# Patient Record
Sex: Female | Born: 1945 | Race: White | Hispanic: No | Marital: Married | Smoking: Never smoker
Health system: Southern US, Community
[De-identification: ages and names within clinical notes are randomized; demographics above are authoritative.]

## PROBLEM LIST (undated history)

## (undated) DIAGNOSIS — K635 Polyp of colon: Secondary | ICD-10-CM

## (undated) DIAGNOSIS — K5790 Diverticulosis of intestine, part unspecified, without perforation or abscess without bleeding: Secondary | ICD-10-CM

## (undated) DIAGNOSIS — M1990 Unspecified osteoarthritis, unspecified site: Secondary | ICD-10-CM

## (undated) DIAGNOSIS — F329 Major depressive disorder, single episode, unspecified: Secondary | ICD-10-CM

## (undated) DIAGNOSIS — K760 Fatty (change of) liver, not elsewhere classified: Secondary | ICD-10-CM

## (undated) DIAGNOSIS — F32A Depression, unspecified: Secondary | ICD-10-CM

## (undated) DIAGNOSIS — K5792 Diverticulitis of intestine, part unspecified, without perforation or abscess without bleeding: Secondary | ICD-10-CM

## (undated) DIAGNOSIS — I10 Essential (primary) hypertension: Secondary | ICD-10-CM

## (undated) DIAGNOSIS — M797 Fibromyalgia: Secondary | ICD-10-CM

## (undated) DIAGNOSIS — E039 Hypothyroidism, unspecified: Secondary | ICD-10-CM

## (undated) DIAGNOSIS — M8580 Other specified disorders of bone density and structure, unspecified site: Secondary | ICD-10-CM

## (undated) DIAGNOSIS — M4800 Spinal stenosis, site unspecified: Secondary | ICD-10-CM

## (undated) HISTORY — DX: Diverticulitis of intestine, part unspecified, without perforation or abscess without bleeding: K57.92

## (undated) HISTORY — PX: BREAST REDUCTION SURGERY: SHX8

## (undated) HISTORY — DX: Hypothyroidism, unspecified: E03.9

## (undated) HISTORY — DX: Major depressive disorder, single episode, unspecified: F32.9

## (undated) HISTORY — PX: LIPOSUCTION TRUNK: SUR833

## (undated) HISTORY — DX: Unspecified osteoarthritis, unspecified site: M19.90

## (undated) HISTORY — PX: ABDOMINAL HYSTERECTOMY: SHX81

## (undated) HISTORY — DX: Polyp of colon: K63.5

## (undated) HISTORY — PX: OTHER SURGICAL HISTORY: SHX169

## (undated) HISTORY — PX: COLONOSCOPY: SHX174

## (undated) HISTORY — PX: POLYPECTOMY: SHX149

## (undated) HISTORY — PX: TUBAL LIGATION: SHX77

## (undated) HISTORY — DX: Depression, unspecified: F32.A

## (undated) HISTORY — DX: Diverticulosis of intestine, part unspecified, without perforation or abscess without bleeding: K57.90

## (undated) HISTORY — DX: Fibromyalgia: M79.7

## (undated) HISTORY — DX: Other specified disorders of bone density and structure, unspecified site: M85.80

## (undated) HISTORY — DX: Spinal stenosis, site unspecified: M48.00

## (undated) HISTORY — DX: Fatty (change of) liver, not elsewhere classified: K76.0

## (undated) HISTORY — DX: Essential (primary) hypertension: I10

---

## 1998-04-22 ENCOUNTER — Ambulatory Visit (HOSPITAL_COMMUNITY): Admission: RE | Admit: 1998-04-22 | Discharge: 1998-04-22 | Payer: Self-pay | Admitting: Internal Medicine

## 1998-06-10 ENCOUNTER — Other Ambulatory Visit: Admission: RE | Admit: 1998-06-10 | Discharge: 1998-06-10 | Payer: Self-pay | Admitting: Plastic Surgery

## 2000-11-30 ENCOUNTER — Encounter: Payer: Self-pay | Admitting: Internal Medicine

## 2000-12-30 ENCOUNTER — Ambulatory Visit (HOSPITAL_BASED_OUTPATIENT_CLINIC_OR_DEPARTMENT_OTHER): Admission: RE | Admit: 2000-12-30 | Discharge: 2000-12-30 | Payer: Self-pay | Admitting: Orthopedic Surgery

## 2003-08-06 ENCOUNTER — Encounter: Payer: Self-pay | Admitting: Family Medicine

## 2003-08-17 ENCOUNTER — Emergency Department (HOSPITAL_COMMUNITY): Admission: EM | Admit: 2003-08-17 | Discharge: 2003-08-17 | Payer: Self-pay | Admitting: *Deleted

## 2003-12-19 ENCOUNTER — Other Ambulatory Visit: Admission: RE | Admit: 2003-12-19 | Discharge: 2003-12-19 | Payer: Self-pay | Admitting: Gynecology

## 2004-01-14 ENCOUNTER — Encounter: Payer: Self-pay | Admitting: Family Medicine

## 2004-01-14 ENCOUNTER — Ambulatory Visit (HOSPITAL_COMMUNITY): Admission: RE | Admit: 2004-01-14 | Discharge: 2004-01-14 | Payer: Self-pay | Admitting: Neurosurgery

## 2004-06-24 ENCOUNTER — Encounter: Payer: Self-pay | Admitting: Family Medicine

## 2005-01-22 ENCOUNTER — Emergency Department (HOSPITAL_COMMUNITY): Admission: EM | Admit: 2005-01-22 | Discharge: 2005-01-23 | Payer: Self-pay | Admitting: Emergency Medicine

## 2005-03-25 ENCOUNTER — Ambulatory Visit: Payer: Self-pay | Admitting: Family Medicine

## 2005-03-26 ENCOUNTER — Encounter: Admission: RE | Admit: 2005-03-26 | Discharge: 2005-03-26 | Payer: Self-pay | Admitting: Family Medicine

## 2005-03-30 ENCOUNTER — Ambulatory Visit: Payer: Self-pay | Admitting: Family Medicine

## 2005-05-06 ENCOUNTER — Emergency Department (HOSPITAL_COMMUNITY): Admission: EM | Admit: 2005-05-06 | Discharge: 2005-05-07 | Payer: Self-pay | Admitting: Emergency Medicine

## 2005-08-05 ENCOUNTER — Ambulatory Visit: Payer: Self-pay | Admitting: Family Medicine

## 2005-09-29 ENCOUNTER — Ambulatory Visit: Payer: Self-pay | Admitting: Family Medicine

## 2005-10-07 ENCOUNTER — Ambulatory Visit: Payer: Self-pay | Admitting: Family Medicine

## 2005-10-08 ENCOUNTER — Encounter: Payer: Self-pay | Admitting: Gastroenterology

## 2005-10-08 ENCOUNTER — Encounter: Payer: Self-pay | Admitting: Family Medicine

## 2005-10-15 ENCOUNTER — Ambulatory Visit: Payer: Self-pay | Admitting: Internal Medicine

## 2005-10-28 ENCOUNTER — Encounter (INDEPENDENT_AMBULATORY_CARE_PROVIDER_SITE_OTHER): Payer: Self-pay | Admitting: *Deleted

## 2005-10-28 ENCOUNTER — Ambulatory Visit: Payer: Self-pay | Admitting: Internal Medicine

## 2005-10-28 LAB — HM COLONOSCOPY

## 2006-01-18 ENCOUNTER — Other Ambulatory Visit: Admission: RE | Admit: 2006-01-18 | Discharge: 2006-01-18 | Payer: Self-pay | Admitting: Gynecology

## 2006-02-02 ENCOUNTER — Ambulatory Visit: Payer: Self-pay | Admitting: Family Medicine

## 2006-04-05 ENCOUNTER — Ambulatory Visit: Payer: Self-pay | Admitting: Family Medicine

## 2006-05-31 ENCOUNTER — Ambulatory Visit: Payer: Self-pay | Admitting: Family Medicine

## 2006-09-29 ENCOUNTER — Ambulatory Visit: Payer: Self-pay | Admitting: Family Medicine

## 2006-09-29 LAB — CONVERTED CEMR LAB
ALT: 107 units/L — ABNORMAL HIGH (ref 0–40)
Alkaline Phosphatase: 97 units/L (ref 39–117)
Bilirubin, Direct: 0.1 mg/dL (ref 0.0–0.3)
Total Bilirubin: 1 mg/dL (ref 0.3–1.2)
Total Protein: 7 g/dL (ref 6.0–8.3)

## 2006-10-18 ENCOUNTER — Ambulatory Visit: Payer: Self-pay | Admitting: Family Medicine

## 2007-01-24 ENCOUNTER — Encounter: Payer: Self-pay | Admitting: Family Medicine

## 2007-01-24 LAB — CONVERTED CEMR LAB

## 2007-01-25 ENCOUNTER — Other Ambulatory Visit: Admission: RE | Admit: 2007-01-25 | Discharge: 2007-01-25 | Payer: Self-pay | Admitting: Gynecology

## 2007-05-30 ENCOUNTER — Encounter: Payer: Self-pay | Admitting: Family Medicine

## 2007-05-30 DIAGNOSIS — Z8659 Personal history of other mental and behavioral disorders: Secondary | ICD-10-CM

## 2007-05-30 DIAGNOSIS — E781 Pure hyperglyceridemia: Secondary | ICD-10-CM | POA: Insufficient documentation

## 2007-05-30 DIAGNOSIS — IMO0001 Reserved for inherently not codable concepts without codable children: Secondary | ICD-10-CM

## 2007-05-30 DIAGNOSIS — I10 Essential (primary) hypertension: Secondary | ICD-10-CM

## 2007-05-30 DIAGNOSIS — K5730 Diverticulosis of large intestine without perforation or abscess without bleeding: Secondary | ICD-10-CM | POA: Insufficient documentation

## 2007-05-30 DIAGNOSIS — M8580 Other specified disorders of bone density and structure, unspecified site: Secondary | ICD-10-CM

## 2007-05-30 DIAGNOSIS — M479 Spondylosis, unspecified: Secondary | ICD-10-CM | POA: Insufficient documentation

## 2007-05-30 DIAGNOSIS — E039 Hypothyroidism, unspecified: Secondary | ICD-10-CM | POA: Insufficient documentation

## 2007-05-30 DIAGNOSIS — M4802 Spinal stenosis, cervical region: Secondary | ICD-10-CM

## 2007-05-31 ENCOUNTER — Ambulatory Visit: Payer: Self-pay | Admitting: Family Medicine

## 2007-05-31 DIAGNOSIS — R740 Nonspecific elevation of levels of transaminase and lactic acid dehydrogenase [LDH]: Secondary | ICD-10-CM

## 2007-08-08 ENCOUNTER — Ambulatory Visit: Payer: Self-pay | Admitting: Family Medicine

## 2007-08-09 LAB — CONVERTED CEMR LAB
ALT: 77 units/L — ABNORMAL HIGH (ref 0–35)
AST: 71 units/L — ABNORMAL HIGH (ref 0–37)
Alkaline Phosphatase: 78 units/L (ref 39–117)
Bilirubin, Direct: 0.1 mg/dL (ref 0.0–0.3)
Total Bilirubin: 0.9 mg/dL (ref 0.3–1.2)
Total Protein: 7.4 g/dL (ref 6.0–8.3)

## 2007-09-28 ENCOUNTER — Ambulatory Visit: Payer: Self-pay | Admitting: Family Medicine

## 2007-09-28 DIAGNOSIS — E669 Obesity, unspecified: Secondary | ICD-10-CM

## 2007-10-05 ENCOUNTER — Telehealth: Payer: Self-pay | Admitting: Family Medicine

## 2007-11-13 ENCOUNTER — Encounter (INDEPENDENT_AMBULATORY_CARE_PROVIDER_SITE_OTHER): Payer: Self-pay | Admitting: *Deleted

## 2007-11-27 ENCOUNTER — Ambulatory Visit: Payer: Self-pay | Admitting: Family Medicine

## 2007-11-28 LAB — CONVERTED CEMR LAB
ALT: 68 units/L — ABNORMAL HIGH (ref 0–35)
AST: 58 units/L — ABNORMAL HIGH (ref 0–37)
Albumin: 3.9 g/dL (ref 3.5–5.2)
TSH: 6 microintl units/mL — ABNORMAL HIGH (ref 0.35–5.50)
Total Protein: 7.2 g/dL (ref 6.0–8.3)

## 2007-12-01 ENCOUNTER — Ambulatory Visit: Payer: Self-pay | Admitting: Family Medicine

## 2007-12-04 ENCOUNTER — Telehealth: Payer: Self-pay | Admitting: Family Medicine

## 2007-12-04 ENCOUNTER — Encounter (INDEPENDENT_AMBULATORY_CARE_PROVIDER_SITE_OTHER): Payer: Self-pay | Admitting: *Deleted

## 2007-12-05 ENCOUNTER — Encounter: Payer: Self-pay | Admitting: Family Medicine

## 2008-01-16 ENCOUNTER — Encounter: Payer: Self-pay | Admitting: Family Medicine

## 2008-02-23 ENCOUNTER — Ambulatory Visit: Payer: Self-pay | Admitting: Family Medicine

## 2008-02-23 DIAGNOSIS — N289 Disorder of kidney and ureter, unspecified: Secondary | ICD-10-CM | POA: Insufficient documentation

## 2008-02-23 LAB — CONVERTED CEMR LAB
Bacteria, UA: 0
Casts: 0 /lpf
Glucose, Urine, Semiquant: NEGATIVE
Ketones, urine, test strip: NEGATIVE
Nitrite: NEGATIVE
Protein, U semiquant: NEGATIVE
Specific Gravity, Urine: <1.005
Urine crystals, microscopic: 0 /hpf
WBC Urine, dipstick: NEGATIVE
Yeast, UA: 0

## 2008-02-26 LAB — CONVERTED CEMR LAB
Albumin: 4 g/dL (ref 3.5–5.2)
Alkaline Phosphatase: 83 units/L (ref 39–117)
BUN: 9 mg/dL (ref 6–23)
Bilirubin, Direct: 0.1 mg/dL (ref 0.0–0.3)
CO2: 30 meq/L (ref 19–32)
Chloride: 105 meq/L (ref 96–112)
GFR calc Af Amer: 82 mL/min
GFR calc non Af Amer: 67 mL/min
Potassium: 4.2 meq/L (ref 3.5–5.1)
Total Protein: 7.1 g/dL (ref 6.0–8.3)

## 2008-09-10 ENCOUNTER — Ambulatory Visit: Payer: Self-pay | Admitting: Family Medicine

## 2008-10-15 ENCOUNTER — Telehealth: Payer: Self-pay | Admitting: Family Medicine

## 2008-11-11 ENCOUNTER — Ambulatory Visit: Payer: Self-pay | Admitting: Family Medicine

## 2008-11-11 DIAGNOSIS — M6740 Ganglion, unspecified site: Secondary | ICD-10-CM | POA: Insufficient documentation

## 2008-11-19 ENCOUNTER — Encounter: Payer: Self-pay | Admitting: Family Medicine

## 2009-01-31 ENCOUNTER — Ambulatory Visit: Payer: Self-pay | Admitting: Family Medicine

## 2009-03-11 ENCOUNTER — Ambulatory Visit: Payer: Self-pay | Admitting: Family Medicine

## 2009-06-10 DIAGNOSIS — Z862 Personal history of diseases of the blood and blood-forming organs and certain disorders involving the immune mechanism: Secondary | ICD-10-CM

## 2009-06-10 DIAGNOSIS — Z860100 Personal history of colon polyps, unspecified: Secondary | ICD-10-CM | POA: Insufficient documentation

## 2009-06-10 DIAGNOSIS — K648 Other hemorrhoids: Secondary | ICD-10-CM | POA: Insufficient documentation

## 2009-06-10 DIAGNOSIS — K7689 Other specified diseases of liver: Secondary | ICD-10-CM | POA: Insufficient documentation

## 2009-06-10 DIAGNOSIS — Z8639 Personal history of other endocrine, nutritional and metabolic disease: Secondary | ICD-10-CM

## 2009-07-30 ENCOUNTER — Ambulatory Visit: Payer: Self-pay | Admitting: Gastroenterology

## 2009-07-30 DIAGNOSIS — K5732 Diverticulitis of large intestine without perforation or abscess without bleeding: Secondary | ICD-10-CM

## 2009-08-06 ENCOUNTER — Telehealth: Payer: Self-pay | Admitting: Nurse Practitioner

## 2009-08-20 ENCOUNTER — Telehealth: Payer: Self-pay | Admitting: Family Medicine

## 2009-08-22 ENCOUNTER — Ambulatory Visit: Payer: Self-pay | Admitting: Family Medicine

## 2009-08-23 LAB — CONVERTED CEMR LAB
ALT: 52 units/L — ABNORMAL HIGH (ref 0–35)
AST: 49 units/L — ABNORMAL HIGH (ref 0–37)
Alkaline Phosphatase: 85 units/L (ref 39–117)
Chloride: 105 meq/L (ref 96–112)
Cholesterol: 191 mg/dL (ref 0–200)
Glucose, Bld: 108 mg/dL — ABNORMAL HIGH (ref 70–99)
HDL: 51.7 mg/dL (ref >39.00)
Phosphorus: 3.3 mg/dL (ref 2.3–4.6)
Potassium: 4.2 meq/L (ref 3.5–5.1)
Sodium: 142 meq/L (ref 135–145)
Total Protein: 7.9 g/dL (ref 6.0–8.3)
Triglycerides: 192 mg/dL — ABNORMAL HIGH (ref 0.0–149.0)

## 2009-09-29 ENCOUNTER — Ambulatory Visit: Payer: Self-pay | Admitting: Internal Medicine

## 2009-10-22 ENCOUNTER — Ambulatory Visit: Payer: Self-pay | Admitting: Family Medicine

## 2009-12-10 ENCOUNTER — Telehealth: Payer: Self-pay | Admitting: Internal Medicine

## 2010-02-16 ENCOUNTER — Telehealth: Payer: Self-pay | Admitting: Internal Medicine

## 2010-02-20 ENCOUNTER — Telehealth: Payer: Self-pay | Admitting: Internal Medicine

## 2010-03-02 ENCOUNTER — Ambulatory Visit: Payer: Self-pay | Admitting: Internal Medicine

## 2010-08-16 ENCOUNTER — Encounter: Payer: Self-pay | Admitting: Neurosurgery

## 2010-08-25 NOTE — Assessment & Plan Note (Signed)
Summary: F/U ABD PAIN, SAW PAULA GUENTHER RNP    History of Present Illness Visit Type: Follow-up Visit Primary GI MD: Lina Sar MD Primary Provider: Roxy Manns, MD Requesting Provider: n/a Chief Complaint: F/u for abd pain. Pt states that she is great and denies any GI complaints  History of Present Illness:   This is a 65 year old white female with chronic intermittent left lower quadrant abdominal pain. She was seen on 07/30/09 and was treated with a 10 day course of Cipro 500 mg p.o. b.i.d. She had a colonoscopy in 1996 with findings of an adenomatous polyp. A subsequent colonoscopy in 1999 showed severe diverticulosis of the left colon. Her colonoscopy in 2002 showed a hyperplastyic polyp and her most recent colonoscopy in April 2007 did not show any polyps. An upper abdominal ultrasound in March 2007 showed fatty liver. She is much improved today having only minimal left lower quadrant abdominal discomfort. There is no family history of colon cancer. Additional medical problems include high blood pressure, obesity and fibromyalgia.Marland Kitchen   GI Review of Systems      Denies abdominal pain, acid reflux, belching, bloating, chest pain, dysphagia with liquids, dysphagia with solids, heartburn, loss of appetite, nausea, vomiting, vomiting blood, weight loss, and  weight gain.        Denies anal fissure, black tarry stools, change in bowel habit, constipation, diarrhea, diverticulosis, fecal incontinence, heme positive stool, hemorrhoids, irritable bowel syndrome, jaundice, light color stool, liver problems, rectal bleeding, and  rectal pain.    Current Medications (verified): 1)  Metoprolol Tartrate 100 Mg Tabs (Metoprolol Tartrate) .... Take One By Mouth Twice A Day By Mouth  Allergies (verified): 1)  Codeine  Past History:  Past Medical History: Reviewed history from 07/30/2009 and no changes required. Diverticulosis,  colon Diverticulitis Hypertension Hypothyroidism Osteopenia Arthritis Fibromyalgia  Past Surgical History: Reviewed history from 07/30/2009 and no changes required. Hysterectomy- partial, bleeding Tubal Ligation Carpal Tunnel Repair Eyelid surgery Breast reduction Liposuction on stomach  Colonoscopy- polyp (1610) Right wrist fracture at work (09/1999) Colonoscopy- diverticulosis (11/2000) Deg disk disease L 3-4, L5- S1 907/2001) Stress cardiolite- neg (03/2002) Abd [place]- fatty liver (09/2005) Colonoscopy- polyp (hyperplastic), hemorrhoids (10/2005) MRI- LS deg facet arthropathy (10/2005)  Family History: Reviewed history from 07/30/2009 and no changes required. Father: heart problems Mother: deceased- kidney cancer, HTN Siblings: 2  brothers, 2 sisters No FH of Colon Cancer: Family History of Prostate Cancer: Brother Family History of Stomach Cancer: Brother  Social History: Reviewed history from 07/30/2009 and no changes required. 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  Alcohol Use - no Illicit Drug Use - no  Review of Systems  The patient denies allergy/sinus, anemia, anxiety-new, arthritis/joint pain, back pain, blood in urine, breast changes/lumps, change in vision, confusion, cough, coughing up blood, depression-new, fainting, fatigue, fever, headaches-new, hearing problems, heart murmur, heart rhythm changes, itching, menstrual pain, muscle pains/cramps, night sweats, nosebleeds, pregnancy symptoms, shortness of breath, skin rash, sleeping problems, sore throat, swelling of feet/legs, swollen lymph glands, thirst - excessive , urination - excessive , urination changes/pain, urine leakage, vision changes, and voice change.         Pertinent positive and negative review of systems were noted in the above HPI. All other ROS was otherwise negative.   Vital Signs:  Patient profile:   65 year old  female Height:      62 inches Weight:      220 pounds BMI:  40.38 BSA:     1.99 Pulse rate:   64 / minute Pulse rhythm:   regular BP sitting:   126 / 80  (left arm) Cuff size:   regular  Vitals Entered By: Ok Anis CMA (September 29, 2009 8:25 AM)  Physical Exam  General:  overweight. Mouth:  No deformity or lesions, dentition normal. Neck:  Supple; no masses or thyromegaly. Lungs:  Clear throughout to auscultation. Heart:  Regular rate and rhythm; no murmurs, rubs,  or bruits. Abdomen:  soft obese abdomen with normoactive bowel sounds. Minimal tenderness and left lower quadrant. Right upper quadrant and right lower quadrants are unremarkable. Extremities:  No clubbing, cyanosis, edema or deformities noted. Skin:  Intact without significant lesions or rashes. Psych:  Alert and cooperative. Normal mood and affect.   Impression & Recommendations:  Problem # 1:  PERSONAL HX COLONIC POLYPS (ICD-V12.72) Patient's last colonoscopy was in April 2007. A recall colonoscopy will be due in April 2014.  Problem # 2:  DIVERTICULITIS-COLON (ICD-562.11) Patient has severe left colon diverticulosis documented on prior colonoscopies. She is status post flareup of diverticulitis, not documented on a CT scan. She will stay on a high-fiber diet and then begin Benefiber one teaspoon daily. We have talked about the possibly of a sigmoid resection in the future if she has repeated attacks.  Problem # 3:  LIVER FUNCTION TESTS, ABNORMAL, HX OF (ICD-V12.2) Assessment: Comment Only  Patient Instructions: 1)  high-fiber diet. 2)  Weight loss. 3)  Benefiber 1 tablespoon daily. 4)  Recall colonoscopy April 2014. 5)  Copy sent to : Dr Judie Petit.Tower 6)  The medication list was reviewed and reconciled.  All changed / newly prescribed medications were explained.  A complete medication list was provided to the patient / caregiver.

## 2010-08-25 NOTE — Assessment & Plan Note (Signed)
Summary: R SHOULDER PAIN/CLE   Vital Signs:  Patient profile:   65 year old female Height:      62 inches Weight:      223.8 pounds BMI:     41.08 Temp:     97.9 degrees F oral Pulse rate:   64 / minute Pulse rhythm:   regular BP sitting:   150 / 80  (left arm) Cuff size:   regular  Vitals Entered By: Benny Lennert CMA Duncan Dull) (October 22, 2009 10:49 AM)  History of Present Illness: Chief complaint Right shoulder pain since friday  5 days ago when rotated arm internal rotation..felt pain in right shoulder. Noted swelling below rgiht shoulder blade. Now with soreness in right deltiod. No numbness, no weakness. Radiates up to neck. No movement makes it worse. Ibuprofen 600 mg  helps a small amount.    Plays a lot of golf but has not recently.   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 Take One By Mouth Twice A Day By Mouth 2)  Diclofenac Sodium 75 Mg Tbec (Diclofenac Sodium) .Marland Kitchen.. 1 Tab By Mouth Two Times A Day 3)  Cyclobenzaprine Hcl 10 Mg Tabs (Cyclobenzaprine Hcl) .Marland Kitchen.. 1 Tab By  Mouth At Bedtime As Needed Muscle Spasm  Allergies: 1)  Codeine  Past History:  Past medical, surgical, family and social histories (including risk factors) reviewed, and no changes noted (except as noted below).  Past Medical History: Reviewed history from 07/30/2009 and no changes required. Diverticulosis, colon Diverticulitis Hypertension Hypothyroidism Osteopenia Arthritis Fibromyalgia  Past Surgical History: Reviewed history from 07/30/2009 and no changes required. Hysterectomy- partial, bleeding Tubal Ligation Carpal Tunnel Repair Eyelid surgery Breast reduction Liposuction on stomach  Colonoscopy- polyp (1610) Right wrist fracture at work (09/1999) Colonoscopy- diverticulosis (11/2000) Deg disk disease L 3-4, L5- S1 907/2001) Stress cardiolite- neg (03/2002) Abd [place]- fatty liver (09/2005) Colonoscopy- polyp (hyperplastic), hemorrhoids (10/2005) MRI- LS deg facet arthropathy (10/2005)  Family History: Reviewed history from 07/30/2009 and no changes required. Father: heart problems Mother: deceased- kidney cancer, HTN Siblings: 2  brothers, 2 sisters No FH of Colon Cancer: Family History of Prostate Cancer: Brother Family History of Stomach Cancer: Brother  Social History: Reviewed history from 07/30/2009 and no changes required. 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  Alcohol Use - no Illicit Drug Use - no  Review of Systems General:  Denies fatigue and fever. CV:  Denies chest pain or discomfort. Resp:  Denies shortness of breath.  Physical Exam  General:  Well-developed,well-nourished,in no acute distress; alert,appropriate and cooperative throughout examination Lungs:  Normal respiratory effort, chest  expands symmetrically. Lungs are clear to auscultation, no crackles or wheezes. Heart:  Normal rate and regular rhythm. S1 and S2 normal without gallop, murmur, click, rub or other  extra sounds.   Shoulder/Elbow Exam  Skin:    Intact, no scars, lesions, rashes, cafe au lait spots or bruising.    Inspection:    Inspection is normal.    Shoulder Exam:    Right:    Inspection:  Normal    Palpation:  Abnormal       Location:  over roight upper trapezius ansuperior to scapula    Stability:  stable    Swelling:  no    Erythema:  no  Impingement Sign NEER:    Right negative Impingement Sign HAWKINS:    Right negative Apprehension Sign:    Right negative AC joint Adduction Test:    Right negative   Impression & Recommendations:  Problem # 1:  SPRAIN&STRAIN UNSPEC SITE SHOULDER&UPPER ARM (ICD-840.9) No clear shoulder bursitis or RCM tear/tendonitis...more likely trapezius and scapular muscle strain. Treat with NSAIDs, heat, gentle upper back  and shoulder stretching. Muscle relaxant prn. Info given. Follow up if not improving in 2 weeks. stretching  Complete Medication List: 1)  Metoprolol Tartrate 100 Mg Tabs (Metoprolol tartrate) .... Take one by mouth twice a day by mouth 2)  Diclofenac Sodium 75 Mg Tbec (Diclofenac sodium) .Marland Kitchen.. 1 tab by mouth two times a day 3)  Cyclobenzaprine Hcl 10 Mg Tabs (Cyclobenzaprine hcl) .Marland Kitchen.. 1 tab by mouth at bedtime as needed muscle spasm  Patient Instructions: 1)  Heat, gentle stretching and diclofenac. 2)  Follow up if not improving in 2 weeks.  Prescriptions: CYCLOBENZAPRINE HCL 10 MG TABS (CYCLOBENZAPRINE HCL) 1 tab by mouth at bedtime as needed muscle spasm  #20 x 0   Entered and Authorized by:   Kerby Nora MD   Signed by:   Kerby Nora MD on 10/22/2009   Method used:   Print then Give to Patient   RxID:   [id number] DICLOFENAC SODIUM 75 MG TBEC (DICLOFENAC SODIUM) 1 tab by mouth two times a day  #30 x 0   Entered and Authorized by:   Kerby Nora MD   Signed by:   Kerby Nora MD on 10/22/2009   Method used:   Print then Give to Patient   RxID:   [id number]   Current Allergies (reviewed  today): CODEINE

## 2010-08-25 NOTE — Progress Notes (Signed)
Summary: needs letter for gym  Phone Note Call from Patient Call back at Home Phone [phone]   Caller: Patient Call For: Judith Part MD Summary of Call: Pt states she needs a letter of medical clearance for a gym that she is joining- will be working out on treadmill, will work with weights, some water exercises.  She says this has to be on a prescription pad.  She has made an appt for friday and will pick it up then. Initial call taken by: Lowella Petties CMA,  August 20, 2009 3:14 PM  Follow-up for Phone Call        is clear to exercise note put in in box Follow-up by: Judith Part MD,  August 20, 2009 3:38 PM

## 2010-08-25 NOTE — Progress Notes (Signed)
Summary: triage  Medications Added AUGMENTIN 875-125 MG TABS (AMOXICILLIN-POT CLAVULANATE) 1 by mouth once daily DICYCLOMINE HCL 10 MG CAPS (DICYCLOMINE HCL) 1 by mouth three times a day       Phone Note Call from Patient Call back at Home Phone [phone] Call back at [phone]   Caller: Patient Call For: Erin [name] Reason for Call: Talk to Nurse Summary of Call: Patient has flare up of diveritculities Initial call taken by: Tawni Levy,  Dec 10, 2009 9:28 AM  Follow-up for Phone Call        c/o LLQ pain that started on Friday.  Denies fever or constipation.  C/o constant pain.  Pain is consistent with her episode of Diverticulitis in January.  She reports she spoke with Dr Erin [name] over the weekend and was told to call back if pain had not improved.  Dr Erin [name] please advise Follow-up by: Darcey Nora RN, CGRN,  Dec 10, 2009 11:10 AM  Additional Follow-up for Phone Call Additional follow up Details #1::        I don't recall talking to anybody this past weekend ( was it ?Amy?). She needs to go back on antibiotics. Start Augmentin 875 mg by mouth once daily, #10, bowl rest ( full liquids x 48 hrs), Bentyl 10 mg by mouth three times a day x 10 days. Call back in not better in 4 days. Additional Follow-up by: Hart Carwin MD,  Dec 10, 2009 1:34 PM    Additional Follow-up for Phone Call Additional follow up Details #2::    Patient  advised of Dr Regino Schultze instructions and when asked about who she spoke to this weekend, she reports she was transfered to a nursing service out of [facility] and she was told that she should contact the office on Monday am.  I advised the patient we will have to look further into the nursing service that she was transfered to as we don't use a nursing service to triage on the weekends.  I have forwardered this info along to [place] our Print production planner. Follow-up by: Darcey Nora RN, CGRN,  Dec 10, 2009 1:58 PM  New/Updated Medications: AUGMENTIN  875-125 MG TABS (AMOXICILLIN-POT CLAVULANATE) 1 by mouth once daily DICYCLOMINE HCL 10 MG CAPS (DICYCLOMINE HCL) 1 by mouth three times a day Prescriptions: DICYCLOMINE HCL 10 MG CAPS (DICYCLOMINE HCL) 1 by mouth three times a day  #30 x 0   Entered by:   Darcey Nora RN, CGRN   Authorized by:   Hart Carwin MD   Signed by:   Darcey Nora RN, CGRN on 12/10/2009   Method used:   Electronically to        [facility]  [address] [phone]* (retail)       [address]       [place], [place]  [address]       Ph: [phone]       Fax: [phone]   RxID:   [id number] AUGMENTIN 875-125 MG TABS (AMOXICILLIN-POT CLAVULANATE) 1 by mouth once daily  #10 x 0   Entered by:   Darcey Nora RN, CGRN   Authorized by:   Hart Carwin MD   Signed by:   Darcey Nora RN, CGRN on 12/10/2009   Method used:   [facility]        [facility]  [address] [id number]       [address]  [place], [place]  [address]       Ph: [phone]       Fax: [phone]   RxID:   [id number]

## 2010-08-25 NOTE — Progress Notes (Signed)
Summary: Triage   Phone Note Call from Patient Call back at Home Phone [phone]   Caller: Patient Call For: Dr. Juanda Chance Reason for Call: Talk to Nurse Summary of Call: Continuing LLQ pain Initial call taken by: Karna Christmas,  February 16, 2010 5:03 PM  Follow-up for Phone Call        Pt. states she has daily LLQ pain, some days worse than others. Even after antibiotics the pain continues. She takes Metamucil daily and has regular,formed BM's. Also takes Activia daily. Pt. wants Dr.Brodies opinion on her condition.    Follow-up by: Laureen Ochs LPN,  February 17, 2010 9:00 AM  Additional Follow-up for Phone Call Additional follow up Details #1::        spoke with pt, achy left colon. Suspect chronic left colon malfunction. Discussed CT sca, Bentyl, segmental resection, antibiotics. She is not interested at this time. Additional Follow-up by: Hart Carwin MD,  February 17, 2010 1:43 PM

## 2010-08-25 NOTE — Progress Notes (Signed)
Summary: CAT scan   Phone Note Call from Patient Call back at [phone]   Caller: Patient Call For: Dr. Juanda Chance Reason for Call: Talk to Nurse Summary of Call: would like to discuss scheduling CAT scan Initial call taken by: Vallarie Mare,  February 20, 2010 12:42 PM  Follow-up for Phone Call        Answered questions re CT scan.  Pt wants to go ahead and sch the CT scan. Follow-up by: Ashok Cordia RN,  February 20, 2010 2:36 PM  Additional Follow-up for Phone Call Additional follow up Details #1::        Ok to schedule CT scan of the abd/pelvis r/o diverticulitis/colitis, with oral and IV contrast Additional Follow-up by: Hart Carwin MD,  February 21, 2010 12:50 PM  New Problems: ABDOMINAL PAIN, UNSPECIFIED SITE (ICD-789.00)   Additional Follow-up for Phone Call Additional follow up Details #2::    LM for pt to call.   Lupita Leash Surface RN  February 23, 2010 10:21 AM  LM for pt to call.   Ashok Cordia RN  February 24, 2010 9:07 AM  Talked with pt.  SHe wants to go ahead and have the CT scan done. Lupita Leash Surface RN  February 24, 2010 9:37 AM  CT scan scheduled for 03/02/10 at 9:00 [facility].  Pt notified of appt.  Will stop by for instructions and contrast. Follow-up by: Ashok Cordia RN,  February 24, 2010 9:54 AM  New Problems: ABDOMINAL PAIN, UNSPECIFIED SITE (ICD-789.00)

## 2010-08-25 NOTE — Assessment & Plan Note (Signed)
Summary: ? DIVERTICULITIS FLARE/SP(DR BRODIE PT)   History of Present Illness Visit Type: new patient Primary GI MD: Lina Sar MD Primary Provider: Roxy Manns, MD Chief Complaint: abdominal pain, ? divertic flare History of Present Illness:   Patient followed by Dr. Juanda Chance for history of colon polyps and diverticulitits. She was last seen April 2007. A few days ago patient developed moderate LLQ pain reminscent of diverticulitis. Pain radiated across to right lower abdomen, lasted several hours. Since then, LLQ pain more low grade. It is not affected by meals, defecation or bowel movements. BMs were fine prior to onset of pain but she was constipated couple of days later. No nausea and vomiting. No fever or chills. No rectal bleeding.    GI Review of Systems    Reports abdominal pain.     Location of  Abdominal pain: LLQ.    Denies acid reflux, belching, bloating, chest pain, dysphagia with liquids, dysphagia with solids, heartburn, loss of appetite, nausea, vomiting, vomiting blood, weight loss, and  weight gain.      Reports diverticulosis, hemorrhoids, and  irritable bowel syndrome.     Denies anal fissure, black tarry stools, change in bowel habit, constipation, diarrhea, fecal incontinence, heme positive stool, jaundice, light color stool, liver problems, rectal bleeding, and  rectal pain.   Current Medications (verified): 1)  Metoprolol Tartrate 100 Mg Tabs (Metoprolol Tartrate) .... Take One By Mouth Twice A Day By Mouth  Allergies: 1)  Codeine  Past History:  Past Medical History: Diverticulosis, colon Diverticulitis Hypertension Hypothyroidism Osteopenia Arthritis Fibromyalgia  Past Surgical History: Hysterectomy- partial, bleeding Tubal Ligation Carpal Tunnel Repair Eyelid surgery Breast reduction Liposuction on stomach  Colonoscopy- polyp (1997) Right wrist fracture at work (09/1999) Colonoscopy- diverticulosis (11/2000) Deg disk disease L 3-4, L5- S1  907/2001) Stress cardiolite- neg (03/2002) Abd [place]- fatty liver (09/2005) Colonoscopy- polyp (hyperplastic), hemorrhoids (10/2005) MRI- LS deg facet arthropathy (10/2005)  Family History: Father: heart problems Mother: deceased- kidney cancer, HTN Siblings: 2  brothers, 2 sisters No FH of Colon Cancer: Family History of Prostate Cancer: Brother Family History of Stomach Cancer: Brother  Social History: Marital Status: Married Children: 3 (1boy, 2 girls) Occupation: Radiographer, therapeutic, 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  Alcohol Use - no Illicit Drug Use - no  Review of Systems       The patient complains of back pain.  The patient denies allergy/sinus, anemia, anxiety-new, arthritis/joint pain, blood in urine, breast changes/lumps, change in vision, confusion, cough, coughing up blood, depression-new, fainting, fatigue, fever, headaches-new, hearing problems, heart murmur, heart rhythm changes, itching, menstrual pain, muscle pains/cramps, night sweats, nosebleeds, pregnancy symptoms, shortness of breath, skin rash, sleeping problems, sore throat, swelling of feet/legs, swollen lymph glands, thirst - excessive , urination - excessive , urination changes/pain, urine leakage, vision changes, and voice change.    Vital Signs:  Patient profile:   65 year old female Height:      62 inches Weight:      217 pounds BMI:     39.83 Pulse rate:   60 / minute Pulse rhythm:   regular BP sitting:   122 / 84  (left arm) Cuff size:   large  Vitals Entered By: Francee Piccolo CMA Duncan Dull) (July 30, 2009 8:34 AM)  Physical Exam  General:  Well developed, well nourished, no acute distress. Head:  Normocephalic and atraumatic. Eyes:  Conjunctiva pink, no icterus.  Neck:  no obvious masses  Lungs:  Clear throughout to auscultation. Heart:  Regular rate  and rhythm; no murmurs, rubs,  or bruits. Abdomen:  Abdomen soft,  nondistended. Mild LLQ tenderness. No obvious masses or hepatomegaly.Normal bowel  sounds.  Neurologic:  Alert and  oriented x4;  grossly normal neurologically. Skin:  Intact without significant lesions or rashes. Cervical Nodes:  No significant cervical adenopathy. Psych:  Alert and cooperative. Normal mood and affect.  Impression & Recommendations:  Problem # 1:  DIVERTICULITIS-COLON (ICD-562.11) Assessment Deteriorated LLQ pain, probably  low grade diverticulitis. She hasn't had a flare in approximately 5 years. Colonoscopy September 1999 with findings of  severe left-sided diverticulosis with thickened folds and erythema of the mucosa suggesting some inflammatory changes. Responded to Cipro in the past. Will treat for 7 days.  Patient concerned about eating corn and peanuts over the weekend. I explained that is no scientific evidence that seeds and nuts cause diverticulitis. Advised her to avoid roughage for a few days. She will call with medical update in a week.   Problem # 2:  PERSONAL HX COLONIC POLYPS (ICD-V12.72) Adenoma in 1996. Her last colonoscopy in 2007 was negative for polyps. She is due for surveillance exam in 2012.   Patient Instructions: 1)  We have sent a perscription to [facility] [address] for Cipro. Take as directed.   2)  Call us back in 1 week.  If your symptoms have not improved call us and we will make you a follow up appointment.  3)  Copy sent to : Roxy Manns, MD  Prescriptions: CIPRO 500 MG TABS (CIPROFLOXACIN HCL) Take 1 tab twice daily x 7 days  #14 x 0   Entered by:   Lowry Ram NCMA   Authorized by:   Willette Cluster NP   Signed by:   Lowry Ram NCMA on 07/30/2009   Method used:   [facility] to        [facility]  [address] [phone]* (retail)       [address]       [place], [place]  [address]       Ph: [phone]       Fax: [phone]   RxID:   [id number]

## 2010-08-25 NOTE — Progress Notes (Signed)
Summary: Progress report.   Phone Note Outgoing Call   Call placed by: Joselyn Glassman,  August 06, 2009 4:12 PM Call placed to: Patient Summary of Call: Called pt to get progress report.  She is doing much better and just finished her Cipro.  She thanked me for checking on her. I told her to call us if she has any problems. Initial call taken by: Joselyn Glassman,  August 06, 2009 4:14 PM  Follow-up for Phone Call        Glad she is feeling better. Pam, please make sure she has a follow up with primary GI doc.  Made appt for pt to see Dr. Juanda Chance on 09-29-09 at 8:30AM.  Pt thanked me for making appt for her. Follow-up by: Joselyn Glassman,  August 18, 2009 11:24 AM

## 2010-09-07 ENCOUNTER — Telehealth: Payer: Self-pay | Admitting: Family Medicine

## 2010-09-08 ENCOUNTER — Other Ambulatory Visit: Payer: Self-pay | Admitting: Family Medicine

## 2010-09-08 ENCOUNTER — Encounter: Payer: Self-pay | Admitting: Family Medicine

## 2010-09-08 ENCOUNTER — Ambulatory Visit (INDEPENDENT_AMBULATORY_CARE_PROVIDER_SITE_OTHER): Payer: Federal, State, Local not specified - PPO | Admitting: Family Medicine

## 2010-09-08 DIAGNOSIS — E785 Hyperlipidemia, unspecified: Secondary | ICD-10-CM

## 2010-09-08 DIAGNOSIS — R740 Nonspecific elevation of levels of transaminase and lactic acid dehydrogenase [LDH]: Secondary | ICD-10-CM

## 2010-09-08 DIAGNOSIS — I10 Essential (primary) hypertension: Secondary | ICD-10-CM

## 2010-09-08 DIAGNOSIS — E781 Pure hyperglyceridemia: Secondary | ICD-10-CM

## 2010-09-08 DIAGNOSIS — R7401 Elevation of levels of liver transaminase levels: Secondary | ICD-10-CM

## 2010-09-08 DIAGNOSIS — E039 Hypothyroidism, unspecified: Secondary | ICD-10-CM

## 2010-09-08 LAB — RENAL FUNCTION PANEL
Albumin: 4 g/dL (ref 3.5–5.2)
BUN: 10 mg/dL (ref 6–23)
CO2: 33 mEq/L — ABNORMAL HIGH (ref 19–32)
Calcium: 9.6 mg/dL (ref 8.4–10.5)
Chloride: 105 mEq/L (ref 96–112)
GFR: 79.99 mL/min (ref >60.00)

## 2010-09-08 LAB — CBC WITH DIFFERENTIAL/PLATELET
Basophils Absolute: 0 10*3/uL (ref 0.0–0.1)
HCT: 43.8 % (ref 36.0–46.0)
Hemoglobin: 15.3 g/dL — ABNORMAL HIGH (ref 12.0–15.0)
Lymphs Abs: 3.2 10*3/uL (ref 0.7–4.0)
MCV: 91.3 fl (ref 78.0–100.0)
Monocytes Relative: 9 % (ref 3.0–12.0)
Neutro Abs: 2.9 10*3/uL (ref 1.4–7.7)
RDW: 13.2 % (ref 11.5–14.6)

## 2010-09-08 LAB — TSH: TSH: 6.01 u[IU]/mL — ABNORMAL HIGH (ref 0.35–5.50)

## 2010-09-08 LAB — HEPATIC FUNCTION PANEL
ALT: 32 U/L (ref 0–35)
AST: 35 U/L (ref 0–37)
Albumin: 4 g/dL (ref 3.5–5.2)

## 2010-09-08 LAB — LIPID PANEL: Cholesterol: 175 mg/dL (ref 0–200)

## 2010-09-16 NOTE — Progress Notes (Signed)
Summary: Request refill of med  Phone Note Call from Patient Call back at [phone]   Caller: Patient Call For: Erin Part MD Summary of Call: [facility] that she [facility]. also pt needs refill on Metoprolol. Pt last seen 10/22/09 by Dr Ermalene Searing. Pt needs appt to see Dr Milinda Antis. Pt said she does not need to come in because she is fine. I explained to pt why she needs to be seen and pt scheduled appt 09/08/10 for refill and note.  Initial call taken by: Lewanda Rife LPN,  September 07, 2010 10:01 AM

## 2010-09-16 NOTE — Assessment & Plan Note (Signed)
Summary: Needs med refill/ri   Vital Signs:  Patient profile:   65 year old female Height:      62 inches Weight:      216.50 pounds BMI:     39.74 Temp:     98.5 degrees F oral Pulse rate:   60 / minute Pulse rhythm:   regular BP sitting:   142 / 90  (left arm) Cuff size:   regular  Vitals Entered By: Lewanda Rife LPN (September 08, 2010 12:19 PM)  Serial Vital Signs/Assessments:  Time      Position  BP       Pulse  Resp  Temp     By                     150/90                         Judith Part MD  CC: Needs refill on Metoprolol   History of Present Illness: here for f/u of HTN   overall is doing better with her fibro and back and joint problems  stopped taking nsaids -- ibuprofen and aleve- due to abd pain  no more diclofenac either  is ok without it for a month   wants to get off metoprolol - but bp is still about the same [facility]   is eating healthier- since her husb became diabetic  both following more of a low glycemic diet       Allergies: 1)  Codeine  Past History:  Past Medical History: Last updated: 07/30/2009 Diverticulosis, colon Diverticulitis Hypertension Hypothyroidism Osteopenia Arthritis Fibromyalgia  Past Surgical History: Last updated: 07/30/2009 Hysterectomy- partial, bleeding Tubal Ligation Carpal Tunnel Repair Eyelid surgery Breast reduction Liposuction on stomach  Colonoscopy- polyp (1997) Right wrist fracture at work (09/1999) Colonoscopy- diverticulosis (11/2000) Deg disk disease L 3-4, L5- S1 907/2001) Stress cardiolite- neg (03/2002) Abd [place]- fatty liver (09/2005) Colonoscopy- polyp (hyperplastic), hemorrhoids (10/2005) MRI- LS deg facet arthropathy (10/2005)  Family History: Last updated: 07/30/2009 Father: heart problems Mother: deceased- kidney cancer, HTN Siblings: 2  brothers, 2 sisters No FH of Colon Cancer: Family History of Prostate Cancer:  Brother Family History of Stomach Cancer: Brother  Social History: Last updated: 07/30/2009 Marital Status: Married Children: 3 (1boy, 2 girls) Occupation: Radiographer, therapeutic, 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  Alcohol Use - no Illicit Drug Use - no  Risk Factors: Smoking Status: never (05/30/2007)  Review of Systems General:  Complains of fatigue; denies loss of appetite and malaise. Eyes:  Denies blurring and eye irritation. CV:  Denies chest pain or discomfort, lightheadness, and palpitations. Resp:  Denies cough, shortness of breath, and wheezing. GI:  Denies abdominal pain, change in bowel habits, indigestion, and nausea. GU:  Denies dysuria and urinary frequency. MS:  Complains of joint pain, muscle aches, and stiffness; denies cramps. Derm:  Denies itching, lesion(s), poor wound healing, and rash. Neuro:  Denies numbness and tingling. Psych:  mood is fairly good . Endo:  Denies cold intolerance, excessive thirst, excessive urination, and heat intolerance. Heme:  Denies abnormal bruising and bleeding.  Physical Exam  General:  obese and well appearing  Head:  normocephalic, atraumatic, and no abnormalities observed.   Eyes:  vision grossly intact, pupils equal, pupils round, and pupils reactive to light.  no conjunctival pallor, injection or icterus  Ears:  R ear normal and L ear normal.  Nose:  no nasal discharge.   Mouth:  pharynx pink and moist.   Neck:  supple with full rom and no masses or thyromegally, no JVD or carotid bruit  Chest Wall:  No deformities, masses, or tenderness noted. Lungs:  Normal respiratory effort, chest expands symmetrically. Lungs are clear to auscultation, no crackles or wheezes. Heart:  Normal rate and regular rhythm. S1 and S2 normal without gallop, murmur, click, rub or other extra sounds. Abdomen:  Bowel sounds positive,abdomen soft and non-tender without masses, organomegaly or hernias noted. no renal bruits  Msk:  No deformity or scoliosis noted  of thoracic or lumbar spine.  no acute joint changes  Pulses:  R and L carotid,radial,femoral,dorsalis pedis and posterior tibial pulses are full and equal bilaterally Extremities:  No clubbing, cyanosis, edema, or deformity noted with normal full range of motion of all joints.   Neurologic:  sensation intact to light touch, gait normal, and DTRs symmetrical and normal.   Skin:  Intact without suspicious lesions or rashes Cervical Nodes:  No lymphadenopathy noted Psych:  normal affect, talkative and pleasant    Impression & Recommendations:  Problem # 1:  HYPERTENSION (ICD-401.9) Assessment Deteriorated  bp up  work on lifestyle change and wt loss  exercise  add lisinopril lab today f/u 4-6 wk Her updated medication list for this problem includes:    Metoprolol Tartrate 100 Mg Tabs (Metoprolol tartrate) .Marland Kitchen... Take one by mouth twice a day by mouth    Lisinopril 10 Mg Tabs (Lisinopril) .Marland Kitchen... 1 by mouth once daily in am  Orders: Venipuncture (81191) TLB-Lipid Panel (80061-LIPID) TLB-Renal Function Panel (80069-RENAL) TLB-CBC Platelet - w/Differential (85025-CBCD) TLB-Hepatic/Liver Function Pnl (80076-HEPATIC) TLB-TSH (Thyroid Stimulating Hormone) (47829-FAO) Prescription Created Electronically [id number])  BP today: 142/90 Prior BP: 150/80 (10/22/2009)  Labs Reviewed: K+: 4.2 (08/22/2009) Creat: : 0.8 (08/22/2009)   Chol: 191 (08/22/2009)   HDL: 51.70 (08/22/2009)   LDL: 101 (08/22/2009)   TG: 192.0 (08/22/2009)  Problem # 2:  OBESITY (ICD-278.00) Assessment: Deteriorated disc long term plan for diet and exercise to loose wt disc potential risks that obesity pose to her health wise   Problem # 3:  TRANSAMINASES, SERUM, ELEVATED (ICD-790.4) Assessment: Unchanged urged wt loss for fatty liver  check labs   Orders: Venipuncture (57846) TLB-Lipid Panel (80061-LIPID) TLB-Renal Function Panel (80069-RENAL) TLB-CBC Platelet - w/Differential (85025-CBCD) TLB-Hepatic/Liver  Function Pnl (80076-HEPATIC) TLB-TSH (Thyroid Stimulating Hormone) (84443-TSH)  Problem # 4:  HYPOTHYROIDISM (ICD-244.9) Assessment: Unchanged  labs today no clinical changes  pt not on med - subclinical  Orders: Venipuncture (96295) TLB-Lipid Panel (80061-LIPID) TLB-Renal Function Panel (80069-RENAL) TLB-CBC Platelet - w/Differential (85025-CBCD) TLB-Hepatic/Liver Function Pnl (80076-HEPATIC) TLB-TSH (Thyroid Stimulating Hormone) (84443-TSH)  Labs Reviewed: TSH: 6.00 (11/27/2007)    Chol: 191 (08/22/2009)   HDL: 51.70 (08/22/2009)   LDL: 101 (08/22/2009)   TG: 192.0 (08/22/2009)  Complete Medication List: 1)  Metoprolol Tartrate 100 Mg Tabs (Metoprolol tartrate) .... Take one by mouth twice a day by mouth 2)  Cyclobenzaprine Hcl 10 Mg Tabs (Cyclobenzaprine hcl) .Marland Kitchen.. 1 tab by mouth at bedtime as needed muscle spasm 3)  Lisinopril 10 Mg Tabs (Lisinopril) .Marland Kitchen.. 1 by mouth once daily in am 4)  Criteen Brizzi Is Free To Exercise Without Restriction  .... --Elishua Radford md  Patient Instructions: 1)  your blood pressure is high  2)  continue the metoprolol  3)  add lisinopril 1 pill daily  4)  watch sodium in diet  5)  work on weight loss  6)  start exercise  7)  labs today  8)  follow up with me in about 4-6 weeks  Prescriptions: CRITEEN Dishner IS FREE TO EXERCISE WITHOUT RESTRICTION --Roxy Manns MD  #1 x 0   Entered and Authorized by:   Judith Part MD   Signed by:   Judith Part MD on 09/08/2010   Method used:   Print then Give to Patient   RxID:   [id number] METOPROLOL TARTRATE 100 MG TABS (METOPROLOL TARTRATE) take one by mouth twice a day by mouth  #60 x 11   Entered and Authorized by:   Judith Part MD   Signed by:   Judith Part MD on 09/08/2010   Method used:   [id number]       [address]       [place], [place]  [address]       Ph: [phone]       Fax: [phone]   RxID:    [id number] LISINOPRIL 10 MG TABS (LISINOPRIL) 1 by mouth once daily in am  #30 x 11   Entered and Authorized by:   Judith Part MD   Signed by:   Judith Part MD on 09/08/2010   Method used:   [facility]        [id number]       [address]       [place], [place]  [address]       Ph: [phone]       Fax: [phone]   RxID:   [id number]    Orders Added: 1)  Venipuncture [57322] 2)  TLB-Lipid Panel [80061-LIPID] 3)  TLB-Renal Function Panel [80069-RENAL] 4)  TLB-CBC Platelet - w/Differential [85025-CBCD] 5)  TLB-Hepatic/Liver Function Pnl [80076-HEPATIC] 6)  TLB-TSH (Thyroid Stimulating Hormone) [84443-TSH] 7)  Prescription Created Electronically [G8553] 8)  Est. Patient Level IV [02542]    Current Allergies (reviewed today): CODEINE

## 2010-09-25 ENCOUNTER — Encounter: Payer: Self-pay | Admitting: Family Medicine

## 2010-10-06 ENCOUNTER — Ambulatory Visit (INDEPENDENT_AMBULATORY_CARE_PROVIDER_SITE_OTHER): Payer: Medicare Other | Admitting: Family Medicine

## 2010-10-06 ENCOUNTER — Encounter: Payer: Self-pay | Admitting: Family Medicine

## 2010-10-06 DIAGNOSIS — E039 Hypothyroidism, unspecified: Secondary | ICD-10-CM

## 2010-10-06 DIAGNOSIS — E781 Pure hyperglyceridemia: Secondary | ICD-10-CM

## 2010-10-06 DIAGNOSIS — H6980 Other specified disorders of Eustachian tube, unspecified ear: Secondary | ICD-10-CM

## 2010-10-06 DIAGNOSIS — I10 Essential (primary) hypertension: Secondary | ICD-10-CM

## 2010-10-06 DIAGNOSIS — K7689 Other specified diseases of liver: Secondary | ICD-10-CM

## 2010-10-13 NOTE — Letter (Signed)
[facility]   Imported By: Kassie Mends 10/02/2010 07:56:43  _____________________________________________________________________  External Attachment:    Type:   Image     Comment:   External Document

## 2010-10-22 NOTE — Assessment & Plan Note (Signed)
Summary: 4-6WEEK FOLLOWUP/[facility]   Vital Signs:  Patient profile:   65 year old female Height:      62 inches Weight:      217 pounds BMI:     39.83 Temp:     98.3 degrees F oral Pulse rate:   60 / minute Pulse rhythm:   regular BP sitting:   132 / 86  (left arm) Cuff size:   regular  Vitals Entered By: Lewanda Rife LPN (October 06, 2010 10:04 AM) CC: 4-6 week f/u and pt has lt earache   History of Present Illness: here for f/u of HTN and lipids and subclinical hypothyroidism and fatty liver in addition to new problem of earache  is feeling fair in general   [facility] - got [facility]ox for sinusitis  is better but ear L is still hurting no fever  facial pain is better  eyes feel tired  pollen allergies are starting up   wt is u 1 lb here- at home her wt is down but hit a plateu  trouble getting it off  eating more vegatables/ green exercise is sporatic  some golf  no more sweets in house   first bp is 132/86  tsh is stable at 6  have not tx due to lack of symptoms  no symptoms at all   lipids with trig 233(up) and HDL 45 (down ) and stalbe LDL of 98 eats too much   liver tests nl today    Allergies: 1)  Codeine  Past History:  Past Medical History: Last updated: 07/30/2009 Diverticulosis, colon Diverticulitis Hypertension Hypothyroidism Osteopenia Arthritis Fibromyalgia  Past Surgical History: Last updated: 07/30/2009 Hysterectomy- partial, bleeding Tubal Ligation Carpal Tunnel Repair Eyelid surgery Breast reduction Liposuction on stomach  Colonoscopy- polyp (1997) Right wrist fracture at work (09/1999) Colonoscopy- diverticulosis (11/2000) Deg disk disease L 3-4, L5- S1 907/2001) Stress cardiolite- neg (03/2002) Abd [place]- fatty liver (09/2005) Colonoscopy- polyp (hyperplastic), hemorrhoids (10/2005) MRI- LS deg facet arthropathy (10/2005)  Family History: Last updated: 07/30/2009 Father: heart problems Mother: deceased-  kidney cancer, HTN Siblings: 2  brothers, 2 sisters No FH of Colon Cancer: Family History of Prostate Cancer: Brother Family History of Stomach Cancer: Brother  Social History: Last updated: 07/30/2009 Marital Status: Married Children: 3 (1boy, 2 girls) Occupation: Radiographer, therapeutic, 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  Alcohol Use - no Illicit Drug Use - no  Risk Factors: Smoking Status: never (05/30/2007)  Review of Systems General:  Denies chills, fatigue, fever, and loss of appetite. Eyes:  Denies blurring and eye pain. ENT:  Complains of earache and postnasal drainage; denies ear discharge and ringing in ears. CV:  Denies chest pain or discomfort, lightheadness, and palpitations. Resp:  Denies cough, shortness of breath, and wheezing. GI:  Denies abdominal pain, change in bowel habits, indigestion, and nausea. GU:  Denies hematuria and urinary frequency. MS:  Denies cramps, muscle weakness, and stiffness. Derm:  Denies itching, lesion(s), poor wound healing, and rash. Neuro:  Denies numbness and tingling. Endo:  Denies cold intolerance, excessive hunger, excessive thirst, excessive urination, and heat intolerance. Heme:  Denies abnormal bruising and bleeding.  Physical Exam  General:  obese and well appearing  Head:  normocephalic, atraumatic, and no abnormalities observed.  no sinus tenderness  Ears:  L TM - dull with small eff R TM clear  Nose:  nares are boggy but not congested Mouth:  pharynx pink and moist, no erythema, and no exudates.   Neck:  supple with full rom and no masses or thyromegally, no JVD or carotid bruit  Chest Wall:  No deformities, masses, or tenderness noted. Lungs:  Normal respiratory effort, chest expands symmetrically. Lungs are clear to auscultation, no crackles or wheezes. Heart:  Normal rate and regular rhythm. S1 and S2 normal without gallop, murmur, click, rub or other extra sounds. Abdomen:  Bowel sounds positive,abdomen soft and non-tender without  masses, organomegaly or hernias noted. no renal bruits  Msk:  No deformity or scoliosis noted of thoracic or lumbar spine.  no acute joint changes  Pulses:  R and L carotid,radial,femoral,dorsalis pedis and posterior tibial pulses are full and equal bilaterally Extremities:  No clubbing, cyanosis, edema, or deformity noted with normal full range of motion of all joints.   Neurologic:  sensation intact to light touch, gait normal, and DTRs symmetrical and normal.   Skin:  Intact without suspicious lesions or rashes Cervical Nodes:  No lymphadenopathy noted Inguinal Nodes:  No significant adenopathy Psych:  normal affect, talkative and pleasant    Impression & Recommendations:  Problem # 1:  FATTY LIVER DISEASE (ICD-571.8) Assessment Improved improved with better diet (and some wt loss at home per pt) urged to keep up low fat diet  disc wt loss- if she is serious will need more low fat protien/ more exercise  Problem # 2:  HYPERTRIGLYCERIDEMIA (ICD-272.1) Assessment: Deteriorated  trig up  suspect from cheese consumption disc other lower fat opt for protien  re check 6 mo  rev low sat fat diet in detail  Labs Reviewed: SGOT: 35 (09/08/2010)   SGPT: 32 (09/08/2010)   HDL:45.10 (09/08/2010), 51.70 (08/22/2009)  LDL:101 (08/22/2009)  Chol:175 (09/08/2010), 191 (08/22/2009)  Trig:233.0 (09/08/2010), 192.0 (08/22/2009)  Problem # 3:  HYPOTHYROIDISM (ICD-244.9) Assessment: Unchanged  stable slt low tsh and subclinical will continue to watch pt does not want any thyroid suppl at this time  Labs Reviewed: TSH: 6.01 (09/08/2010)    Chol: 175 (09/08/2010)   HDL: 45.10 (09/08/2010)   LDL: 101 (08/22/2009)   TG: 233.0 (09/08/2010)  Problem # 4:  HYPERTENSION (ICD-401.9) Assessment: Unchanged  fair control on current meds disc imp of regular exercise - low impact lab and f/u 6 mo  Her updated medication list for this problem includes:    Metoprolol Tartrate 100 Mg Tabs (Metoprolol  tartrate) .Marland Kitchen... Take one by mouth twice a day by mouth    Lisinopril 10 Mg Tabs (Lisinopril) .Marland Kitchen... 1 by mouth once daily in am  BP today: 132/86 Prior BP: 142/90 (09/08/2010)  Labs Reviewed: K+: 4.5 (09/08/2010) Creat: : 0.8 (09/08/2010)   Chol: 175 (09/08/2010)   HDL: 45.10 (09/08/2010)   LDL: 101 (08/22/2009)   TG: 233.0 (09/08/2010)  Problem # 5:  EUSTACHIAN TUBE DYSFUNCTION (ICD-381.81) Assessment: New s/p sinusitis and tx with amox intermittent L ear pain  offered px for steroid nasal spray- pt declined  will update if not imp with time  Complete Medication List: 1)  Metoprolol Tartrate 100 Mg Tabs (Metoprolol tartrate) .... Take one by mouth twice a day by mouth 2)  Cyclobenzaprine Hcl 10 Mg Tabs (Cyclobenzaprine hcl) .Marland Kitchen.. 1 tab by mouth at bedtime as needed muscle spasm 3)  Lisinopril 10 Mg Tabs (Lisinopril) .Marland Kitchen.. 1 by mouth once daily in am 4)  Criteen Tootle Is Free To Exercise Without Restriction  .... --Deiondre Harrower md 5)  Amoxicillin ?mg  .... One capsule twice a day.  Preventive Care Screening     pt declines most health mt  3/12 - risks disc in detail   Patient Instructions: 1)  It is important that you exercise reguarly at least 20 minutes 5 times a week. If you develop chest pain, have severe difficulty breathing, or feel very tired, stop exercising immediately and seek medical attention.  2)  you may have to join weight watchers or count calories (? 1500)  3)  protien with every meal and make that lean  4)  if ear pain does not improve let me know  5)  no change in medicines  6)  schedule fasting labs and then follow up in 6 months lipid/ast/alt / tsh 272, 244.9   Orders Added: 1)  Est. Patient Level IV [16109]    Current Allergies (reviewed today): CODEINE

## 2010-12-11 NOTE — Op Note (Signed)
[place]  Patient:    Erin [name], Erin [name]                       MRN: [id number] Proc. Date: 12/30/00 Adm. Date:  [id number] Attending:  Milly Jakob                           Operative Report  PREOPERATIVE DIAGNOSES: 1. Carpal tunnel syndrome. 2. de Quervain stenosing tenosynovitis.  POSTOPERATIVE DIAGNOSES: 1. Carpal tunnel syndrome. 2. de Quervain stenosing tenosynovitis.  OPERATION: 1. Carpal tunnel release. 2. Injection of de Quervain stenosing tenosynovitis.  SURGEON:  Harvie Junior, M.D.  ASSISTANT:  Currie Paris. Thedore Mins.  ANESTHESIA:  General anesthesia.  BRIEF HISTORY:  The patient is a 65 year old with a long history of having had a wrist fracture, ultimately developed carpal tunnel syndrome on the right side.  She was evaluated and felt to need release of this.  She was also noted to have some first extensor compartment tenosynovitis and ultimately talked about the hospital doing an injection in that area while she was asleep, and she did want to have this done.  DESCRIPTION OF PROCEDURE:  The patient was taken to the operating room after adequate anesthesia was obtained with general anesthesia.  The patient was placed supine on the operating table.  The right arm was then prepped and draped in the usual sterile fashion.  Following Esmarch exsanguination, tourniquet was inflated to 250 mmHg.  Following this, a midline incision was made just ulnar to the midline wrist crease.  Subcutaneous tissue was taken to the level of the volar carpal ligament which was clearly identified and sharply divided.  Care was taken to divide the ligament both proximally and distally.  The median nerve was identified and the motor branch which were noted to be freed up.  At this point, the wound was copiously irrigated and suctioned dry.  The skin was closed with 4-0 nylon interrupted sutures. Sterile compressive dressing was applied.  Prior to  the application of the dressing, an injection was done to the first extensor compartment with 2 of Xylocaine and 1 of Celestone.  The sterile compressive dressing was applied, and the patient was taken to the recovery room where she was noted to be in satisfactory condition. DD:  12/30/00 TD:  12/30/00 Job: [id number]

## 2011-02-15 ENCOUNTER — Encounter: Payer: Self-pay | Admitting: *Deleted

## 2011-02-15 ENCOUNTER — Telehealth: Payer: Self-pay | Admitting: Internal Medicine

## 2011-02-15 MED ORDER — CIPROFLOXACIN HCL 250 MG PO TABS: ORAL_TABLET | ORAL | Status: DC

## 2011-02-15 MED ORDER — DICYCLOMINE HCL 20 MG PO TABS: ORAL_TABLET | ORAL | Status: DC

## 2011-02-15 NOTE — Telephone Encounter (Signed)
Patient calling to report for the last couple of weeks, she has had LLQ pain that is getting worse. States that below her belly button it is sore and swollen. Reports her stools are loose. States she was told to call and get antibiotics for pain like this. Hx colonic polyps, diverticulitis, Last colon- April 2007- no polyps. States she did not keep her last f/u appointment and wants to schedule a f/u also. Scheduled patient for OV on 02/26/11 at 2:30 PM. Please advise on antibiotics for patient.

## 2011-02-15 NOTE — Telephone Encounter (Signed)
Cipro 250 mg, #14, 1 po bid, also Bentyl 20mg , #20 1 po bid,no refills

## 2011-02-15 NOTE — Telephone Encounter (Signed)
Patient given Dr. Regino Schultze recommendations. Rx sent to [facility] [address] as per patient

## 2011-02-15 NOTE — Telephone Encounter (Signed)
Left a message for patient to call me back.

## 2011-02-16 ENCOUNTER — Telehealth: Payer: Self-pay | Admitting: Internal Medicine

## 2011-02-16 NOTE — Telephone Encounter (Signed)
Called in rx[facility] to [facility].

## 2011-02-24 ENCOUNTER — Telehealth: Payer: Self-pay | Admitting: Internal Medicine

## 2011-02-24 NOTE — Telephone Encounter (Signed)
Left a message for patient to call me. 

## 2011-02-25 NOTE — Telephone Encounter (Signed)
Patient took Cipro and now has a vaginal yeast infection. Please, advise.

## 2011-02-25 NOTE — Telephone Encounter (Signed)
Diflucan 100mg , #3, 1 po qd, no refill

## 2011-02-26 ENCOUNTER — Ambulatory Visit (INDEPENDENT_AMBULATORY_CARE_PROVIDER_SITE_OTHER): Payer: Medicare Other | Admitting: Internal Medicine

## 2011-02-26 ENCOUNTER — Encounter: Payer: Self-pay | Admitting: Internal Medicine

## 2011-02-26 VITALS — BP 132/78 | HR 60 | Ht 64.0 in | Wt 211.0 lb

## 2011-02-26 DIAGNOSIS — K5732 Diverticulitis of large intestine without perforation or abscess without bleeding: Secondary | ICD-10-CM

## 2011-02-26 DIAGNOSIS — R1031 Right lower quadrant pain: Secondary | ICD-10-CM

## 2011-02-26 MED ORDER — FLUCONAZOLE 100 MG PO TABS: 100.0000 mg | ORAL_TABLET | Freq: Every day | ORAL | Status: AC

## 2011-02-26 NOTE — Patient Instructions (Addendum)
CC: Dr Milinda Antis

## 2011-02-26 NOTE — Telephone Encounter (Signed)
Erin [name] will tell patient rx has been sent at her 2:30 PM appointment with Dr. Juanda Chance

## 2011-02-26 NOTE — Progress Notes (Signed)
Erin [name] [birth year] MRN [id number]    History of Present Illness:  This is a 65 year old white female with a history of diverticulitis in 1996 and 1999. Her last colonoscopy in April 2007 did not show any evidence of diverticulosis. She has a history of a hyperplastic polyp. She had a recent flareup of left lower quadrant abdominal pain which responded to Cipro 500 mg twice a day for 10 days. She is about 90% improved today. She never took the Bentyl we prescribed. She has been able to resume her regular diet although she still avoids high-fiber foods. This time we have not obtained a CT scan of the abdomen.   Past Medical History  Diagnosis Date  . Diverticulosis   . Diverticulitis   . Hypertension   . Hypothyroidism   . Osteopenia   . Arthritis   . Fibromyalgia   . Colon polyps   . Fatty liver   . Depression   . Spinal stenosis    Past Surgical History  Procedure Date  . Abdominal hysterectomy   . Tubal ligation   . Carpal tunnel repair   . Eyelid surgery   . Breast reduction surgery   . Liposuction trunk     reports that she has never smoked. She has never used smokeless tobacco. She reports that she does not drink alcohol or use illicit drugs. family history includes Heart disease in her father; Hypertension in her mother; Kidney cancer in her mother; Prostate cancer in her brother; and Stomach cancer in her brother.  There is no history of Colon cancer. Allergies  Allergen Reactions  . Codeine     REACTION: itching        Review of Systems: Denies heartburn or shortness of breath dysphagia or chest pain  The remainder of the 10  point ROS is negative except as outlined in H&P   Physical Exam: General appearance  Well developed, in no distress. Eyes- non icteric. HEENT nontraumatic, normocephalic. Mouth no lesions, tongue papillated, no cheilosis. Neck supple without adenopathy, thyroid not enlarged, no carotid bruits, no JVD. Lungs Clear to  auscultation bilaterally. Cor normal S1 normal S2, regular rhythm , no murmur,  quiet precordium. Abdomen tenderness in left lower quadrant without rebound or mass. Soft abdomen. Normal active bowel sounds.  Rectal: Not done. Extremities no pedal edema. Skin no lesions. Neurological alert and oriented x 3. Psychological normal mood and affect.  Assessment and Plan:  Problem #1 Recurrent left lower quadrant abdominal pain which responded to a 10 day course of antibiotics. Patient has a history of diverticulitis in 1996 and 1999. Her last colonoscopy in 2007 did not mention the presence of diverticuli. Irritable bowel syndrome is a possibility as is ischemic colitis. She is much improved. She is due for a colonoscopy in the near future. We have talked about elective resection of the sigmoid colon if she keeps having repeated attacks. She prefers Risk analyst. We will plan a colonoscopy in near future.   02/26/2011 Erin [name]

## 2011-02-26 NOTE — Telephone Encounter (Signed)
Rx sent to pharmacy. Left a message for patient to call me. 

## 2011-08-31 ENCOUNTER — Other Ambulatory Visit: Payer: Self-pay | Admitting: Family Medicine

## 2011-09-01 NOTE — Telephone Encounter (Signed)
[facility] request refill Metoprolol 100 mg. Pt last seen 10/06/10 but did not schedule 6 mth appt.Please advise.

## 2011-09-02 NOTE — Telephone Encounter (Signed)
Spoke with pt and she will be coming in for her f/u.

## 2011-09-02 NOTE — Telephone Encounter (Signed)
Remind her to make a f/u appt please  Will refill electronically

## 2011-09-03 ENCOUNTER — Encounter: Payer: Self-pay | Admitting: Family Medicine

## 2011-09-03 ENCOUNTER — Ambulatory Visit (INDEPENDENT_AMBULATORY_CARE_PROVIDER_SITE_OTHER): Payer: Medicare Other | Admitting: Family Medicine

## 2011-09-03 VITALS — BP 128/70 | HR 60 | Temp 98.1°F | Ht 64.0 in | Wt 215.0 lb

## 2011-09-03 DIAGNOSIS — E781 Pure hyperglyceridemia: Secondary | ICD-10-CM | POA: Diagnosis not present

## 2011-09-03 DIAGNOSIS — E039 Hypothyroidism, unspecified: Secondary | ICD-10-CM

## 2011-09-03 DIAGNOSIS — E669 Obesity, unspecified: Secondary | ICD-10-CM

## 2011-09-03 DIAGNOSIS — I10 Essential (primary) hypertension: Secondary | ICD-10-CM | POA: Diagnosis not present

## 2011-09-03 LAB — CBC WITH DIFFERENTIAL/PLATELET
Basophils Relative: 0.6 % (ref 0.0–3.0)
Eosinophils Relative: 3.5 % (ref 0.0–5.0)
HCT: 43.7 % (ref 36.0–46.0)
Hemoglobin: 15.1 g/dL — ABNORMAL HIGH (ref 12.0–15.0)
Lymphs Abs: 2.9 10*3/uL (ref 0.7–4.0)
Monocytes Relative: 9 % (ref 3.0–12.0)
Neutro Abs: 2.9 10*3/uL (ref 1.4–7.7)
RBC: 4.79 Mil/uL (ref 3.87–5.11)
WBC: 6.6 10*3/uL (ref 4.5–10.5)

## 2011-09-03 LAB — LIPID PANEL
HDL: 44.9 mg/dL (ref >39.00)
Total CHOL/HDL Ratio: 4
VLDL: 54.6 mg/dL — ABNORMAL HIGH (ref 0.0–40.0)

## 2011-09-03 LAB — COMPREHENSIVE METABOLIC PANEL
CO2: 29 mEq/L (ref 19–32)
Calcium: 9.6 mg/dL (ref 8.4–10.5)
Chloride: 105 mEq/L (ref 96–112)
Creatinine, Ser: 0.9 mg/dL (ref 0.4–1.2)
GFR: 71.15 mL/min (ref >60.00)
Glucose, Bld: 89 mg/dL (ref 70–99)
Total Bilirubin: 0.5 mg/dL (ref 0.3–1.2)
Total Protein: 7.3 g/dL (ref 6.0–8.3)

## 2011-09-03 LAB — TSH: TSH: 2.27 u[IU]/mL (ref 0.35–5.50)

## 2011-09-03 LAB — LDL CHOLESTEROL, DIRECT: Direct LDL: 89.8 mg/dL

## 2011-09-03 MED ORDER — METOPROLOL TARTRATE 100 MG PO TABS: 100.0000 mg | ORAL_TABLET | Freq: Two times a day (BID) | ORAL | Status: DC

## 2011-09-03 NOTE — Assessment & Plan Note (Signed)
bp in fair control at this time  No changes needed  Disc lifstyle change with low sodium diet and exercise   On beta blocker alone

## 2011-09-03 NOTE — Progress Notes (Signed)
Subjective:    Patient ID: Erin [name], female    DOB: [birth year], 66 y.o.   MRN: [id number]  HPI Here for f/u of chonic med issues  Is doing well overall    bp is 128/70     Today No cp or palpitations or headaches or edema  On beta blocker alone  No side effects to medicines    Wt is up 4 lb with bmi of 36- weight is a struggle  Diet - is eating healthy now / ? If portions are ok / does not eat much meat  Watches her carbs (with her husband) Exercise-not a lot -- taking care of a grandson who is homeless   Tsh is high  Has had subclinical hypothyroid  Overall stable ,  Lab Results  Component Value Date   TSH 6.01* 09/08/2010   not having any symptoms at all - does not want to treat this (despite wt gain)  Lab Results  Component Value Date   WBC 7.1 09/08/2010   HGB 15.3* 09/08/2010   HCT 43.8 09/08/2010   MCV 91.3 09/08/2010   PLT 228.0 09/08/2010     Lipids- hx of high trig Lab Results  Component Value Date   CHOL 175 09/08/2010   CHOL 191 08/22/2009   Lab Results  Component Value Date   HDL 45.10 09/08/2010   HDL 95.28 08/22/2009   Lab Results  Component Value Date   LDLCALC 101* 08/22/2009   Lab Results  Component Value Date   TRIG 233.0* 09/08/2010   TRIG 192.0* 08/22/2009   Lab Results  Component Value Date   CHOLHDL 4 09/08/2010   CHOLHDL 4 08/22/2009   Lab Results  Component Value Date   LDLDIRECT 98.3 09/08/2010   trig are over 200 Diet- is better than last time - is excited to re check this   imms  Declines all immunizations  No reason - refuses to discuss it   As a matter of fact - refuses health mt of any kind   Patient Active Problem List  Diagnoses  . HYPOTHYROIDISM  . HYPERTRIGLYCERIDEMIA  . OBESITY  . HYPERTENSION  . INTERNAL HEMORRHOIDS  . DIVERTICULOSIS, COLON  . DIVERTICULITIS-COLON  . FATTY LIVER DISEASE  . UNSPECIFIED DISORDER OF KIDNEY AND URETER  . OSTEOARTHRITIS, SPINE  . SPINAL STENOSIS, CERVICAL  . GANGLION CYST  .  FIBROMYALGIA  . OSTEOPENIA  . TRANSAMINASES, SERUM, ELEVATED  . DEPRESSION, MAJOR, HX OF  . LIVER FUNCTION TESTS, ABNORMAL, HX OF  . Personal history of colonic polyps   Past Medical History  Diagnosis Date  . Diverticulosis   . Diverticulitis   . Hypertension   . Hypothyroidism   . Osteopenia   . Arthritis   . Fibromyalgia   . Colon polyps   . Fatty liver   . Depression   . Spinal stenosis    Past Surgical History  Procedure Date  . Abdominal hysterectomy   . Tubal ligation   . Carpal tunnel repair   . Eyelid surgery   . Breast reduction surgery   . Liposuction trunk    History  Substance Use Topics  . Smoking status: Never Smoker   . Smokeless tobacco: Never Used  . Alcohol Use: No   Family History  Problem Relation Age of Onset  . Heart disease Father   . Kidney cancer Mother   . Hypertension Mother   . Kidney disease Mother     kidney CA  . Colon cancer  Neg Hx   . Prostate cancer Brother   . Cancer Brother     prostate CA  . Stomach cancer Brother    Allergies  Allergen Reactions  . Codeine     REACTION: itching   Current Outpatient Prescriptions on File Prior to Visit  Medication Sig Dispense Refill  . dicyclomine (BENTYL) 20 MG tablet Take one po BID  20 tablet  0      Review of Systems Review of Systems  Constitutional: Negative for fever, appetite change, fatigue and unexpected weight change.  Eyes: Negative for pain and visual disturbance.  Respiratory: Negative for cough and shortness of breath.   Cardiovascular: Negative for cp or palpitations    Gastrointestinal: Negative for nausea, diarrhea and constipation.  Genitourinary: Negative for urgency and frequency.  Skin: Negative for pallor or rash   Neurological: Negative for weakness, light-headedness, numbness and headaches.  Hematological: Negative for adenopathy. Does not bruise/bleed easily.  Psychiatric/Behavioral: Negative for dysphoric mood. The patient is not nervous/anxious.            Objective:   Physical Exam  Constitutional: She appears well-developed and well-nourished. No distress.       Obese and well appearing   HENT:  Head: Normocephalic and atraumatic.  Mouth/Throat: Oropharynx is clear and moist.  Eyes: Conjunctivae and EOM are normal. Pupils are equal, round, and reactive to light. No scleral icterus.  Neck: Normal range of motion. Neck supple. No JVD present. Carotid bruit is not present. No thyromegaly present.  Cardiovascular: Normal rate, regular rhythm, normal heart sounds and intact distal pulses.  Exam reveals no gallop.   Pulmonary/Chest: Effort normal and breath sounds normal. No respiratory distress. She has no wheezes.  Abdominal: Soft. Bowel sounds are normal. She exhibits distension. She exhibits no abdominal bruit and no mass. There is no tenderness.  Musculoskeletal: Normal range of motion. She exhibits no edema and no tenderness.  Lymphadenopathy:    She has no cervical adenopathy.  Neurological: She is alert. She has normal reflexes. No cranial nerve deficit. She exhibits normal muscle tone. Coordination normal.  Skin: Skin is warm and dry. No rash noted. No erythema. No pallor.  Psychiatric: She has a normal mood and affect.          Assessment & Plan:

## 2011-09-03 NOTE — Assessment & Plan Note (Signed)
Diet is some improved  Check today Rev low fat diet and also need for wt loss

## 2011-09-03 NOTE — Assessment & Plan Note (Signed)
Discussed how this problem influences overall health and the risks it imposes  Reviewed plan for weight loss with lower calorie diet (via better food choices and also portion control or program like weight watchers) and exercise building up to or more than 30 minutes 5 days per week including some aerobic activity   for the record- pt declines health mt of any kind

## 2011-09-03 NOTE — Assessment & Plan Note (Signed)
tsh has been mildly high Since no symptoms (subclinical) pt has declined tx Check today

## 2011-09-03 NOTE — Patient Instructions (Addendum)
Labs today  bp looks good  You need to exercise 5 days per week -- indoors or out  If you cannot leave the house - investigate exercise videos for home Work on weight loss with low fat and low sugar diet and cutting portions  Try to get 1200-1500 mg of calcium per day with at least 1000 iu of vitamin D - for bone health

## 2011-10-05 DIAGNOSIS — J309 Allergic rhinitis, unspecified: Secondary | ICD-10-CM | POA: Diagnosis not present

## 2011-12-02 ENCOUNTER — Other Ambulatory Visit: Payer: Self-pay | Admitting: Family Medicine

## 2012-02-06 ENCOUNTER — Other Ambulatory Visit: Payer: Self-pay | Admitting: Family Medicine

## 2012-02-07 ENCOUNTER — Other Ambulatory Visit: Payer: Self-pay

## 2012-02-07 MED ORDER — METOPROLOL TARTRATE 100 MG PO TABS: 100.0000 mg | ORAL_TABLET | Freq: Two times a day (BID) | ORAL | Status: DC

## 2012-02-07 NOTE — Telephone Encounter (Signed)
Informed patient

## 2012-02-07 NOTE — Telephone Encounter (Signed)
Ok to refill? In the meds list it says there were 11 refills approved but on this request it says 2 refills.

## 2012-02-07 NOTE — Telephone Encounter (Signed)
Will refill electronically  

## 2012-02-14 ENCOUNTER — Telehealth: Payer: Self-pay | Admitting: Internal Medicine

## 2012-02-14 NOTE — Telephone Encounter (Signed)
Patient calling to report she is having pain in her left side below belly button. She always has a dull pain there but it has gotten worse the last two days. Pain goes to hip bone and down leg too. The pain decreases when laying down but still throbs. Denies fever, diarrhea, constipation or bleeding. Hx diverticulitis, last colon- 2007. Please, advise.

## 2012-02-14 NOTE — Telephone Encounter (Signed)
Please start Cipro 250 mg po bid, # 14, no refil...., and Flagyl 250 mg po tid, #21,no refill, and set up an OVisit with me or with an extender. She will likely need a colonoscopy when things settle down

## 2012-02-15 MED ORDER — CIPROFLOXACIN HCL 250 MG PO TABS: ORAL_TABLET | ORAL | Status: DC

## 2012-02-15 MED ORDER — METRONIDAZOLE 250 MG PO TABS: 250.0000 mg | ORAL_TABLET | Freq: Three times a day (TID) | ORAL | Status: AC

## 2012-02-15 NOTE — Telephone Encounter (Signed)
rx sent

## 2012-02-15 NOTE — Telephone Encounter (Signed)
Spoke with patient and gave her Dr. Regino Schultze recommendations. She will call back after checking her schedule to make an appointment.

## 2012-02-15 NOTE — Telephone Encounter (Signed)
Spoke with patient and scheduled OV on 03/10/12 at 2:00 PM.

## 2012-03-10 ENCOUNTER — Encounter: Payer: Self-pay | Admitting: Internal Medicine

## 2012-03-10 ENCOUNTER — Ambulatory Visit (INDEPENDENT_AMBULATORY_CARE_PROVIDER_SITE_OTHER): Payer: Medicare Other | Admitting: Internal Medicine

## 2012-03-10 ENCOUNTER — Telehealth: Payer: Self-pay | Admitting: Internal Medicine

## 2012-03-10 VITALS — BP 124/80 | HR 62 | Ht 64.0 in | Wt 216.0 lb

## 2012-03-10 DIAGNOSIS — K5732 Diverticulitis of large intestine without perforation or abscess without bleeding: Secondary | ICD-10-CM

## 2012-03-10 MED ORDER — ALIGN 4 MG PO CAPS: 1.0000 | ORAL_CAPSULE | ORAL | Status: DC

## 2012-03-10 MED ORDER — DICYCLOMINE HCL 20 MG PO TABS: 20.0000 mg | ORAL_TABLET | Freq: Three times a day (TID) | ORAL | Status: DC

## 2012-03-10 NOTE — Telephone Encounter (Signed)
rx should be for tid dosing. [facility] advised.

## 2012-03-10 NOTE — Patient Instructions (Addendum)
We have sent the following medications to your pharmacy for you to pick up at your convenience: Bentyl We have given you samples of Align. This puts good bacteria back into your colon. You should take 1 capsule by mouth once daily. If this works well for you, it can be purchased over the counter. You will be due for a recall colonoscopy in 10/2012. We will send you a reminder in the mail when it gets closer to that time. CC: Dr Roxy Manns

## 2012-03-10 NOTE — Progress Notes (Signed)
Erin [name] 01-30-1946 MRN [id number]  History of Present Illness:  This is a 66 year old white female with recurrent episodes of diverticulitis which date back to1996. She had a colonoscopy in 1999 and again in 2002 which confirmed the presence of diverticuli. A colonoscopy in April 2007 showed a hyperplastic polyp. She had an attack of left lower quadrant abdominal pain which responded to a combination of Cipro and Flagyl. She was unable to finish Flagyl because of nausea. She is currently much better. She takes probiotics. She denies rectal bleeding, diarrhea or constipation. She has been physically active, playing golf. She has been on a regular diet. There is no family history of colon cancer.   Past Medical History  Diagnosis Date  . Diverticulosis   . Diverticulitis   . Hypertension   . Hypothyroidism   . Osteopenia   . Arthritis   . Fibromyalgia   . Colon polyps   . Fatty liver   . Depression   . Spinal stenosis    Past Surgical History  Procedure Date  . Abdominal hysterectomy   . Tubal ligation   . Carpal tunnel repair   . Eyelid surgery   . Breast reduction surgery   . Liposuction trunk     reports that she has never smoked. She has never used smokeless tobacco. She reports that she does not drink alcohol or use illicit drugs. family history includes Cancer in her brother; Heart disease in her father; Hypertension in her mother; Kidney cancer in her mother; Kidney disease in her mother; Prostate cancer in her brother; and Stomach cancer in her brother.  There is no history of Colon cancer. Allergies  Allergen Reactions  . Codeine     REACTION: itching        Review of Systems:  The remainder of the 10 point ROS is negative except as outlined in H&P   Physical Exam: General appearance  Well developed, in no distress. Eyes- non icteric. HEENT nontraumatic, normocephalic. Mouth no lesions, tongue papillated, no cheilosis. Neck supple without adenopathy,  thyroid not enlarged, no carotid bruits, no JVD. Lungs Clear to auscultation bilaterally. Cor normal S1, normal S2, regular rhythm, no murmur,  quiet precordium. Abdomen: Mild tenderness left lower quadrant. Normal active bowel sounds. Large abdomen. Rectal: Soft Hemoccult negative stool. Extremities no pedal edema. Skin no lesions. Neurological alert and oriented x 3. Psychological normal mood and affect.  Assessment and Plan:  Problem #1 Recurrent episodes of diverticulitis. She is doing well now but if she has another attack, I would like to get a CT scan of the abdomen for documentation. She will be due for a recall colonoscopy in April 2014. I advised her to stay on liquids during the attacks of diverticulitis when she is taking antibiotics. She would like a refill on dicyclomine which seems to help with cramps.   03/10/2012 Erin [name]

## 2012-03-12 ENCOUNTER — Encounter: Payer: Self-pay | Admitting: Internal Medicine

## 2012-04-17 DIAGNOSIS — M19049 Primary osteoarthritis, unspecified hand: Secondary | ICD-10-CM | POA: Diagnosis not present

## 2012-04-17 DIAGNOSIS — M6740 Ganglion, unspecified site: Secondary | ICD-10-CM | POA: Diagnosis not present

## 2012-06-05 ENCOUNTER — Telehealth: Payer: Self-pay | Admitting: Family Medicine

## 2012-06-05 NOTE — Telephone Encounter (Signed)
Caller: [name]/Patient; Patient Name: [name], Erin; PCP: [facility] [employer] [facility]); Best Callback Phone Number: [phone] Onset: 06/05/12 Patient is calling stating that her Stepdaughter is having a baby and it was recommended that anyone that is going to be near the baby has to have a Whooping Cough shot due to a outbreak in the area.  Patient would like to know if this is something that is recommended or required for her to get at this age.  Is unsure as to whether or not it was given as a child.  Per CDC 2013 vaccination guidelines informed patient that All adults 19 years and older, including those 65 years and older should get a dose of Tdap vaccine and that Tdap is especially important for anyone in close contact with infants younger than 48 months old.  Reference: [email or web address] OFFICE NOTE: PATIENT WOULD LIKE TO KNOW IF THIS CAN BE GIVEN AT THIS OFFICE AND IF AN APPOINTMENT IS NEEDED.  PLEASE FOLLOW UP WITH THE PATIENT.

## 2012-06-05 NOTE — Telephone Encounter (Signed)
Please make nurse appt for Tdap vaccine, thanks

## 2012-06-06 NOTE — Telephone Encounter (Signed)
Nurse appt scheduled for tomorrow 

## 2012-06-07 ENCOUNTER — Ambulatory Visit (INDEPENDENT_AMBULATORY_CARE_PROVIDER_SITE_OTHER): Payer: Medicare Other | Admitting: *Deleted

## 2012-06-07 DIAGNOSIS — Z23 Encounter for immunization: Secondary | ICD-10-CM | POA: Diagnosis not present

## 2012-07-29 DIAGNOSIS — J069 Acute upper respiratory infection, unspecified: Secondary | ICD-10-CM | POA: Diagnosis not present

## 2012-10-01 ENCOUNTER — Other Ambulatory Visit: Payer: Self-pay | Admitting: Family Medicine

## 2012-10-02 NOTE — Telephone Encounter (Signed)
Please schedule f/u in spring and refil until then, thanks 

## 2012-10-02 NOTE — Telephone Encounter (Signed)
No recent appt and no future appts, ok to refill? 

## 2012-10-02 NOTE — Telephone Encounter (Signed)
appt scheduled for 11/08/12, and meds refilled

## 2012-10-12 ENCOUNTER — Encounter: Payer: Self-pay | Admitting: *Deleted

## 2012-10-18 ENCOUNTER — Encounter: Payer: Self-pay | Admitting: Internal Medicine

## 2012-10-24 ENCOUNTER — Encounter: Payer: Self-pay | Admitting: Internal Medicine

## 2012-11-06 ENCOUNTER — Telehealth: Payer: Self-pay

## 2012-11-06 NOTE — Telephone Encounter (Signed)
Pt [facility] pt notified and [facility]

## 2012-11-08 ENCOUNTER — Ambulatory Visit (INDEPENDENT_AMBULATORY_CARE_PROVIDER_SITE_OTHER): Payer: Medicare Other | Admitting: Family Medicine

## 2012-11-08 ENCOUNTER — Encounter: Payer: Self-pay | Admitting: Family Medicine

## 2012-11-08 VITALS — BP 124/84 | HR 62 | Temp 98.7°F | Ht 64.0 in | Wt 216.8 lb

## 2012-11-08 DIAGNOSIS — E781 Pure hyperglyceridemia: Secondary | ICD-10-CM | POA: Diagnosis not present

## 2012-11-08 DIAGNOSIS — E039 Hypothyroidism, unspecified: Secondary | ICD-10-CM

## 2012-11-08 DIAGNOSIS — K5732 Diverticulitis of large intestine without perforation or abscess without bleeding: Secondary | ICD-10-CM

## 2012-11-08 DIAGNOSIS — I10 Essential (primary) hypertension: Secondary | ICD-10-CM | POA: Diagnosis not present

## 2012-11-08 MED ORDER — METOPROLOL TARTRATE 100 MG PO TABS: 100.0000 mg | ORAL_TABLET | Freq: Two times a day (BID) | ORAL | Status: DC

## 2012-11-08 MED ORDER — METRONIDAZOLE 500 MG PO TABS: 500.0000 mg | ORAL_TABLET | Freq: Three times a day (TID) | ORAL | Status: DC

## 2012-11-08 MED ORDER — CIPROFLOXACIN HCL 500 MG PO TABS: 500.0000 mg | ORAL_TABLET | Freq: Two times a day (BID) | ORAL | Status: DC

## 2012-11-08 NOTE — Assessment & Plan Note (Signed)
bp in fair control at this time  No changes needed  Disc lifstyle change with low sodium diet and exercise  Refilled med Lab today

## 2012-11-08 NOTE — Progress Notes (Signed)
Subjective:    Patient ID: Erin [name], female    DOB: Aug 03, 1945, 67 y.o.   MRN: [id number]  HPI Here for f/u of chronic medical problems   [place] playing golf [place]   Thinks she has a bout of diverticulitis right now  Has GI f/u next month - Dr Juanda Chance (may have to consider surgery in the future) Took a dicyclomine this am - a little helpful Pain on L side - dull /achey  Sore to the touch  Thinks she had low grade temp last week  No n/v, no blood in stool  Usually gets cipro and flagyl   bp is stable today  No cp or palpitations or headaches or edema  No side effects to medicines  BP Readings from Last 3 Encounters:  11/08/12 124/84  03/10/12 124/80  09/03/11 128/70     Is time for labs  Has hypothyroidism subclinical  No symptoms at all   Wt is stable (bmi is 37)   Due for cholesterol check Lab Results  Component Value Date   CHOL 173 09/03/2011   HDL 44.90 09/03/2011   LDLCALC 101* 08/22/2009   LDLDIRECT 89.8 09/03/2011   TRIG 273.0* 09/03/2011   CHOLHDL 4 09/03/2011    Does not watch diet for cholesterol  Does not eat fried foods Likes butter     Chemistry      Component Value Date/Time   NA 140 09/03/2011 1247   K 4.2 09/03/2011 1247   CL 105 09/03/2011 1247   CO2 29 09/03/2011 1247   BUN 15 09/03/2011 1247   CREATININE 0.9 09/03/2011 1247      Component Value Date/Time   CALCIUM 9.6 09/03/2011 1247   ALKPHOS 80 09/03/2011 1247   AST 31 09/03/2011 1247   ALT 33 09/03/2011 1247   BILITOT 0.5 09/03/2011 1247      Patient Active Problem List  Diagnosis  . HYPOTHYROIDISM  . HYPERTRIGLYCERIDEMIA  . OBESITY  . HYPERTENSION  . INTERNAL HEMORRHOIDS  . DIVERTICULOSIS, COLON  . DIVERTICULITIS-COLON  . FATTY LIVER DISEASE  . UNSPECIFIED DISORDER OF KIDNEY AND URETER  . OSTEOARTHRITIS, SPINE  . SPINAL STENOSIS, CERVICAL  . GANGLION CYST  . FIBROMYALGIA  . OSTEOPENIA  . TRANSAMINASES, SERUM, ELEVATED  . DEPRESSION, MAJOR, HX OF  .  LIVER FUNCTION TESTS, ABNORMAL, HX OF  . Personal history of colonic polyps   Past Medical History  Diagnosis Date  . Diverticulosis   . Diverticulitis   . Hypertension   . Hypothyroidism   . Osteopenia   . Arthritis   . Fibromyalgia   . Colon polyps   . Fatty liver   . Depression   . Spinal stenosis    Past Surgical History  Procedure Laterality Date  . Abdominal hysterectomy    . Tubal ligation    . Carpal tunnel repair    . Eyelid surgery    . Breast reduction surgery    . Liposuction trunk     History  Substance Use Topics  . Smoking status: Never Smoker   . Smokeless tobacco: Never Used  . Alcohol Use: No   Family History  Problem Relation Age of Onset  . Heart disease Father   . Hypertension Mother   . Kidney cancer Mother   . Colon cancer Neg Hx   . Prostate cancer Brother   . Prostate cancer Brother   . Stomach cancer Brother    Allergies  Allergen  Reactions  . Codeine     REACTION: itching   Current Outpatient Prescriptions on File Prior to Visit  Medication Sig Dispense Refill  . dicyclomine (BENTYL) 20 MG tablet Take 1 tablet (20 mg total) by mouth 3 (three) times daily. Take one po BID  90 tablet  2  . metoprolol (LOPRESSOR) 100 MG tablet TAKE 1 TABLET BY MOUTH TWICE A DAY  60 tablet  1   No current facility-administered medications on file prior to visit.     Review of Systems Review of Systems  Constitutional: Negative for fever, appetite change, fatigue and unexpected weight change.  Eyes: Negative for pain and visual disturbance.  Respiratory: Negative for cough and shortness of breath.   Cardiovascular: Negative for cp or palpitations    Gastrointestinal: Negative for nausea, or blood in stool, pos for abd pain / bloating and mild diarrhea  Genitourinary: Negative for urgency and frequency.  Skin: Negative for pallor or rash   Neurological: Negative for weakness, light-headedness, numbness and headaches.  Hematological: Negative for  adenopathy. Does not bruise/bleed easily.  Psychiatric/Behavioral: Negative for dysphoric mood. The patient is not nervous/anxious.         Objective:   Physical Exam  Constitutional: She appears well-developed and well-nourished. No distress.  obese and well appearing   HENT:  Head: Normocephalic and atraumatic.  Left Ear: External ear normal.  Mouth/Throat: Oropharynx is clear and moist.  Eyes: Conjunctivae and EOM are normal. Pupils are equal, round, and reactive to light. Right eye exhibits no discharge. Left eye exhibits no discharge. No scleral icterus.  Neck: Normal range of motion. Neck supple. No JVD present. Carotid bruit is not present. No thyromegaly present.  Cardiovascular: Normal rate, regular rhythm, normal heart sounds and intact distal pulses.  Exam reveals no gallop.   Pulmonary/Chest: Effort normal and breath sounds normal. No respiratory distress. She has no wheezes.  Abdominal: Soft. Bowel sounds are normal. She exhibits no distension, no abdominal bruit and no mass. There is no hepatosplenomegaly. There is tenderness in the left lower quadrant. There is no rebound, no guarding, no CVA tenderness, no tenderness at McBurney's point and negative Murphy's sign.  Musculoskeletal: She exhibits no edema and no tenderness.  Lymphadenopathy:    She has no cervical adenopathy.  Neurological: She is alert. She has normal reflexes. No cranial nerve deficit. She exhibits normal muscle tone. Coordination normal.  Skin: Skin is warm and dry. No rash noted. No erythema. No pallor.  Psychiatric: She has a normal mood and affect.          Assessment & Plan:

## 2012-11-08 NOTE — Assessment & Plan Note (Signed)
Lab today  Disc imp of chol control Pt not motivated for lifestyle change

## 2012-11-08 NOTE — Assessment & Plan Note (Signed)
Subclinical Still no symptoms Lab today for tsh

## 2012-11-08 NOTE — Assessment & Plan Note (Signed)
7 d of cipro and flagyl for acute flare Update if not starting to improve in a week or if worsening   Will f/u with GI as planned

## 2012-11-08 NOTE — Patient Instructions (Addendum)
Blood pressure is in good control  Labs today  Please watch fats and sugar in diet and get exercise and work on weight loss  Take cipro and flagyl for diverticulitis - if not improved let me or your GI doctor know

## 2012-11-09 LAB — COMPREHENSIVE METABOLIC PANEL
AST: 44 U/L — ABNORMAL HIGH (ref 0–37)
Albumin: 4.2 g/dL (ref 3.5–5.2)
Alkaline Phosphatase: 79 U/L (ref 39–117)
Chloride: 104 mEq/L (ref 96–112)
Glucose, Bld: 82 mg/dL (ref 70–99)
Potassium: 4 mEq/L (ref 3.5–5.1)
Sodium: 140 mEq/L (ref 135–145)
Total Protein: 7.4 g/dL (ref 6.0–8.3)

## 2012-11-09 LAB — CBC WITH DIFFERENTIAL/PLATELET
Eosinophils Absolute: 0.2 10*3/uL (ref 0.0–0.7)
Eosinophils Relative: 2.5 % (ref 0.0–5.0)
Lymphocytes Relative: 34.1 % (ref 12.0–46.0)
MCV: 91.1 fl (ref 78.0–100.0)
Monocytes Absolute: 0.5 10*3/uL (ref 0.1–1.0)
Neutrophils Relative %: 54.3 % (ref 43.0–77.0)
Platelets: 219 10*3/uL (ref 150.0–400.0)
WBC: 6.9 10*3/uL (ref 4.5–10.5)

## 2012-11-09 LAB — LIPID PANEL
Total CHOL/HDL Ratio: 5
VLDL: 44 mg/dL — ABNORMAL HIGH (ref 0.0–40.0)

## 2012-11-13 ENCOUNTER — Encounter: Payer: Self-pay | Admitting: *Deleted

## 2012-11-25 DIAGNOSIS — M25569 Pain in unspecified knee: Secondary | ICD-10-CM | POA: Diagnosis not present

## 2012-11-30 ENCOUNTER — Ambulatory Visit (AMBULATORY_SURGERY_CENTER): Payer: Medicare Other

## 2012-11-30 VITALS — Ht 64.0 in | Wt 213.8 lb

## 2012-11-30 DIAGNOSIS — K5732 Diverticulitis of large intestine without perforation or abscess without bleeding: Secondary | ICD-10-CM

## 2012-11-30 DIAGNOSIS — Z86010 Personal history of colonic polyps: Secondary | ICD-10-CM

## 2012-11-30 DIAGNOSIS — Z1211 Encounter for screening for malignant neoplasm of colon: Secondary | ICD-10-CM

## 2012-11-30 MED ORDER — MOVIPREP 100 G PO SOLR: ORAL | Status: DC

## 2012-12-14 ENCOUNTER — Ambulatory Visit (AMBULATORY_SURGERY_CENTER): Payer: Medicare Other | Admitting: Internal Medicine

## 2012-12-14 ENCOUNTER — Encounter: Payer: Self-pay | Admitting: Internal Medicine

## 2012-12-14 VITALS — BP 169/93 | HR 60 | Temp 98.1°F | Resp 17 | Ht 64.0 in | Wt 216.0 lb

## 2012-12-14 DIAGNOSIS — Z1211 Encounter for screening for malignant neoplasm of colon: Secondary | ICD-10-CM | POA: Diagnosis not present

## 2012-12-14 DIAGNOSIS — E039 Hypothyroidism, unspecified: Secondary | ICD-10-CM | POA: Diagnosis not present

## 2012-12-14 DIAGNOSIS — K5732 Diverticulitis of large intestine without perforation or abscess without bleeding: Secondary | ICD-10-CM

## 2012-12-14 DIAGNOSIS — D126 Benign neoplasm of colon, unspecified: Secondary | ICD-10-CM

## 2012-12-14 DIAGNOSIS — E669 Obesity, unspecified: Secondary | ICD-10-CM | POA: Diagnosis not present

## 2012-12-14 DIAGNOSIS — F329 Major depressive disorder, single episode, unspecified: Secondary | ICD-10-CM | POA: Diagnosis not present

## 2012-12-14 DIAGNOSIS — IMO0001 Reserved for inherently not codable concepts without codable children: Secondary | ICD-10-CM | POA: Diagnosis not present

## 2012-12-14 DIAGNOSIS — M4800 Spinal stenosis, site unspecified: Secondary | ICD-10-CM | POA: Diagnosis not present

## 2012-12-14 DIAGNOSIS — Z86010 Personal history of colonic polyps: Secondary | ICD-10-CM

## 2012-12-14 DIAGNOSIS — K700 Alcoholic fatty liver: Secondary | ICD-10-CM | POA: Diagnosis not present

## 2012-12-14 MED ORDER — SODIUM CHLORIDE 0.9 % IV SOLN: 500.0000 mL | INTRAVENOUS | Status: DC

## 2012-12-14 NOTE — Op Note (Signed)
[facility] [address] [place], [address]   COLONOSCOPY PROCEDURE REPORT  PATIENT: [name], [name]  MR#: [id number] BIRTHDATE: 07-13-1946 , 67  yrs. old GENDER: Female ENDOSCOPIST: Hart Carwin, MD REFERRED BY:  recall colonoscopy PROCEDURE DATE:  12/14/2012 PROCEDURE:   Colonoscopy with cold biopsy polypectomy ASA CLASS:   Class II INDICATIONS:huperplastic polyp 2007, prior colon 2000. MEDICATIONS: MAC sedation, administered by CRNA and Propofol (Diprivan) 270 mg IV  DESCRIPTION OF PROCEDURE:   After the risks and benefits and of the procedure were explained, informed consent was obtained.  A digital rectal exam revealed no abnormalities of the rectum.    The [id number]  endoscope was introduced through the anus and advanced to the cecum, which was identified by both the appendix and ileocecal valve .  The quality of the prep was good, using MoviPrep .  The instrument was then slowly withdrawn as the colon was fully examined.     COLON FINDINGS: A diminutive smooth sessile polyp was found in the ascending colon.  A polypectomy was performed with cold forceps. The resection was complete and the polyp tissue was completely retrieved.   Mild diverticulosis was noted throughout the entire examined colon.     Retroflexed views revealed no abnormalities. The scope was then withdrawn from the patient and the procedure completed.  COMPLICATIONS: There were no complications. ENDOSCOPIC IMPRESSION: 1.   Diminutive sessile polyp was found in the ascending colon; polypectomy was performed with cold forceps 2.   Mild diverticulosis was noted throughout the entire examined colon  RECOMMENDATIONS: 1.  Await pathology results 2.  High fiber diet   REPEAT EXAM: In 10 year(s)  for Colonoscopy.  cc:  _______________________________ eSignedHart Carwin, MD 12/14/2012 8:38 AM     PATIENT NAME:  Erin [name], Erin [name] MR#: [id number]

## 2012-12-14 NOTE — Progress Notes (Signed)
Informed Tyrone Sage of bp taken this am.bp meds taken this am. Patient is asymptomatic.

## 2012-12-14 NOTE — Progress Notes (Signed)
Report to pacu rn, vss, bbs=clear 

## 2012-12-14 NOTE — Progress Notes (Signed)
Called to room to assist during endoscopic procedure.  Patient ID and intended procedure confirmed with present staff. Received instructions for my participation in the procedure from the performing physician.Called to room to assist during endoscopic procedure.  Patient ID and intended procedure confirmed with present staff. Received instructions for my participation in the procedure from the performing physician. 

## 2012-12-14 NOTE — Progress Notes (Addendum)
Pt upon entering recovery complained of cramping and nausea. Pt did vomit a small amount of clear emesis and with this passed large amount of air. Pt stated that the cramping improved and no further nausea or vomiting. ewm  Pt on knees in bed and passed large amt of air and states feels fine, better and ready for discharge. ewm

## 2012-12-14 NOTE — Progress Notes (Signed)
Patient did not experience any of the following events: a burn prior to discharge; a fall within the facility; wrong site/side/patient/procedure/implant event; or a hospital transfer or hospital admission upon discharge from the facility. (G8907)Patient did not have preoperative order for IV antibiotic SSI prophylaxis. ([id number]) ewm 

## 2012-12-14 NOTE — Patient Instructions (Signed)
YOU HAD AN ENDOSCOPIC PROCEDURE TODAY AT THE [facility]: Refer to the procedure report that was given to you for any specific questions about what was found during the examination.  If the procedure report does not answer your questions, please call your gastroenterologist to clarify.  If you requested that your care partner not be given the details of your procedure findings, then the procedure report has been included in a sealed envelope for you to review at your convenience later.  YOU SHOULD EXPECT: Some feelings of bloating in the abdomen. Passage of more gas than usual.  Walking can help get rid of the air that was put into your GI tract during the procedure and reduce the bloating. If you had a lower endoscopy (such as a colonoscopy or flexible sigmoidoscopy) you may notice spotting of blood in your stool or on the toilet paper. If you underwent a bowel prep for your procedure, then you may not have a normal bowel movement for a few days.  DIET: Your first meal following the procedure should be a light meal and then it is ok to progress to your normal diet.  A half-sandwich or bowl of soup is an example of a good first meal.  Heavy or fried foods are harder to digest and may make you feel nauseous or bloated.  Likewise meals heavy in dairy and vegetables can cause extra gas to form and this can also increase the bloating.  Drink plenty of fluids but you should avoid alcoholic beverages for 24 hours.  ACTIVITY: Your care partner should take you home directly after the procedure.  You should plan to take it easy, moving slowly for the rest of the day.  You can resume normal activity the day after the procedure however you should NOT DRIVE or use heavy machinery for 24 hours (because of the sedation medicines used during the test).    SYMPTOMS TO REPORT IMMEDIATELY: A gastroenterologist can be reached at any hour.  During normal business hours, 8:30 AM to 5:00 PM Monday through Friday,  call [phone].  After hours and on weekends, please call the GI answering service at [phone] emergency number  who will take a message and have the physician on call contact you.   Following lower endoscopy (colonoscopy or flexible sigmoidoscopy):  Excessive amounts of blood in the stool  Significant tenderness or worsening of abdominal pains  Swelling of the abdomen that is new, acute  Fever of 100F or higher  FOLLOW UP: If any biopsies were taken you will be contacted by phone or by letter within the next 1-3 weeks.  Call your gastroenterologist if you have not heard about the biopsies in 3 weeks.  Our staff will call the home number listed on your records the next business day following your procedure to check on you and address any questions or concerns that you may have at that time regarding the information given to you following your procedure. This is a courtesy call and so if there is no answer at the home number and we have not heard from you through the emergency physician on call, we will assume that you have returned to your regular daily activities without incident.  SIGNATURES/CONFIDENTIALITY: You and/or your care partner have signed paperwork which will be entered into your electronic medical record.  These signatures attest to the fact that that the information above on your After Visit Summary has been reviewed and is understood.  Full responsibility of the   confidentiality of this discharge information lies with you and/or your care-partner.  Handout on polyps, diverticulosis, high fiber diet.

## 2012-12-15 ENCOUNTER — Telehealth: Payer: Self-pay | Admitting: *Deleted

## 2012-12-15 NOTE — Telephone Encounter (Signed)
  Follow up Call-  Call back number 12/14/2012  Post procedure Call Back phone  # [phone]  Permission to leave phone message Yes     Patient questions:  Do you have a fever, pain , or abdominal swelling? no Pain Score  0 *  Have you tolerated food without any problems? yes  Have you been able to return to your normal activities? yes  Do you have any questions about your discharge instructions: Diet   no Medications  no Follow up visit  no  Do you have questions or concerns about your Care? no  Actions: * If pain score is 4 or above: No action needed, pain <4.

## 2012-12-21 ENCOUNTER — Encounter: Payer: Self-pay | Admitting: Internal Medicine

## 2013-04-24 ENCOUNTER — Other Ambulatory Visit: Payer: Self-pay | Admitting: Internal Medicine

## 2013-08-13 ENCOUNTER — Encounter: Payer: Self-pay | Admitting: Internal Medicine

## 2013-08-13 ENCOUNTER — Ambulatory Visit (INDEPENDENT_AMBULATORY_CARE_PROVIDER_SITE_OTHER): Payer: Medicare Other | Admitting: Internal Medicine

## 2013-08-13 VITALS — BP 134/86 | HR 86 | Temp 99.5°F | Wt 212.0 lb

## 2013-08-13 DIAGNOSIS — J069 Acute upper respiratory infection, unspecified: Secondary | ICD-10-CM | POA: Diagnosis not present

## 2013-08-13 MED ORDER — AZITHROMYCIN 250 MG PO TABS: ORAL_TABLET | ORAL | Status: DC

## 2013-08-13 NOTE — Patient Instructions (Signed)

## 2013-08-13 NOTE — Progress Notes (Signed)
HPI  Pt presents to the clinic today with c/o cough, fever and sore throat. This started about 3 days ago. She has run low grade fevers up to 99.5. The cough is non productive. She has had an associated headache. She has taken Robitussin OTC which did seem to help. She has no history of allergies or breathing problems. She has not had sick contacts.  Review of Systems      Past Medical History  Diagnosis Date  . Diverticulosis   . Diverticulitis   . Hypertension   . Hypothyroidism   . Osteopenia   . Arthritis   . Fibromyalgia   . Colon polyps   . Fatty liver   . Depression   . Spinal stenosis     Family History  Problem Relation Age of Onset  . Heart disease Father   . Bladder Cancer Father   . Hypertension Mother   . Kidney cancer Mother   . Colon cancer Neg Hx   . Stomach cancer Brother   . Prostate cancer Brother     History   Social History  . Marital Status: Married    Spouse Name: N/A    Number of Children: N/A  . Years of Education: N/A   Occupational History  . Not on file.   Social History Main Topics  . Smoking status: Never Smoker   . Smokeless tobacco: Never Used  . Alcohol Use: No  . Drug Use: No  . Sexual Activity: Not on file   Other Topics Concern  . Not on file   Social History Narrative  . No narrative on file    Allergies  Allergen Reactions  . Flagyl [Metronidazole]     Severe pain with diarrhea  . Codeine     REACTION: itching     Constitutional: Positive headache, fatigue and fever. Denies abrupt weight changes.  HEENT:  Positive sore throat. Denies eye redness, eye pain, pressure behind the eyes, facial pain, nasal congestion, ear pain, ringing in the ears, wax buildup, runny nose or bloody nose. Respiratory: Positive cough. Denies difficulty breathing or shortness of breath.  Cardiovascular: Denies chest pain, chest tightness, palpitations or swelling in the hands or feet.   No other specific complaints in a complete  review of systems (except as listed in HPI above).  Objective:   BP 134/86  Pulse 86  Temp(Src) 99.5 F (37.5 C) (Oral)  Wt 212 lb (96.163 kg)  SpO2 98% Wt Readings from Last 3 Encounters:  08/13/13 212 lb (96.163 kg)  12/14/12 216 lb (97.977 kg)  11/30/12 213 lb 12.8 oz (96.979 kg)     General: Appears her stated age, well developed, well nourished in NAD. HEENT: Head: normal shape and size; Eyes: sclera white, no icterus, conjunctiva pink, PERRLA and EOMs intact; Ears: Tm's gray and intact, normal light reflex; Nose: mucosa pink and moist, septum midline; Throat/Mouth: + PND. Teeth present, mucosa erythematous and moist, no exudate noted, no lesions or ulcerations noted.  Neck: Mild cervical lymphadenopathy. Neck supple, trachea midline. No massses, lumps or thyromegaly present.  Cardiovascular: Normal rate and rhythm. S1,S2 noted.  No murmur, rubs or gallops noted. No JVD or BLE edema. No carotid bruits noted. Pulmonary/Chest: Normal effort and scattered rhonchi bilateral bases. No wheezes, rales or ronchi noted.      Assessment & Plan:   Upper Respiratory Infection, new onset with additional workup required:  Get some rest and drink plenty of water Do salt water gargles for the sore  throat eRx for Azithromax x 5 days Continue Robitussin cough syrup   RTC as needed or if symptoms persist.

## 2013-08-13 NOTE — Progress Notes (Signed)
Pre-visit discussion using our clinic review tool. No additional management support is needed unless otherwise documented below in the visit note.  

## 2013-08-17 ENCOUNTER — Encounter: Payer: Self-pay | Admitting: Family Medicine

## 2013-08-17 ENCOUNTER — Ambulatory Visit (INDEPENDENT_AMBULATORY_CARE_PROVIDER_SITE_OTHER): Payer: Medicare Other | Admitting: Family Medicine

## 2013-08-17 ENCOUNTER — Ambulatory Visit: Payer: Medicare Other | Admitting: Internal Medicine

## 2013-08-17 VITALS — BP 136/86 | HR 62 | Temp 98.4°F | Wt 213.0 lb

## 2013-08-17 DIAGNOSIS — R05 Cough: Secondary | ICD-10-CM | POA: Diagnosis not present

## 2013-08-17 DIAGNOSIS — R059 Cough, unspecified: Secondary | ICD-10-CM

## 2013-08-17 MED ORDER — ALBUTEROL SULFATE HFA 108 (90 BASE) MCG/ACT IN AERS: 1.0000 | INHALATION_SPRAY | Freq: Four times a day (QID) | RESPIRATORY_TRACT | Status: DC | PRN

## 2013-08-17 NOTE — Patient Instructions (Signed)
Use the inhaler if needed.  Take care.  This should gradually get better.

## 2013-08-17 NOTE — Progress Notes (Signed)
Pre-visit discussion using our clinic review tool. No additional management support is needed unless otherwise documented below in the visit note.  Prev note reviewed.  Sick for 1 week.  Seen Monday.  Started on zmax Monday, last pill taken this AM.  No fevers. Still coughing and wheezing.  No vomiting, no diarrhea, no rash.  Coughing causes a headache.  No ear pain now, resolved from earlier in the week.  No ST.  Some mild rhinorrhea recently, clear.  Cough was most bothersome. Nonsmoker.   Meds, vitals, and allergies reviewed.   ROS: See HPI.  Otherwise, noncontributory.  GEN: nad, alert and oriented HEENT: mucous membranes moist, tm w/o erythema, nasal exam w/o erythema, scant clear discharge noted,  OP with mild cobblestoning NECK: supple w/o LA CV: rrr.   PULM: ctab, no inc wob, no rhonchi, no wheeze.  EXT: no edema SKIN: no acute rash

## 2013-08-17 NOTE — Assessment & Plan Note (Signed)
Likely postinfectious. Ctab, no rhonchi, no wheeze.  Use SABA PRN and f/u prn. She agrees. Nontoxic. D/w pt.

## 2013-09-03 DIAGNOSIS — H40019 Open angle with borderline findings, low risk, unspecified eye: Secondary | ICD-10-CM | POA: Diagnosis not present

## 2013-10-09 ENCOUNTER — Encounter: Payer: Self-pay | Admitting: Internal Medicine

## 2013-10-09 ENCOUNTER — Ambulatory Visit (INDEPENDENT_AMBULATORY_CARE_PROVIDER_SITE_OTHER): Payer: Medicare Other | Admitting: Internal Medicine

## 2013-10-09 VITALS — BP 130/82 | HR 55 | Temp 98.2°F | Wt 211.5 lb

## 2013-10-09 DIAGNOSIS — M5430 Sciatica, unspecified side: Secondary | ICD-10-CM

## 2013-10-09 DIAGNOSIS — M5432 Sciatica, left side: Secondary | ICD-10-CM

## 2013-10-09 DIAGNOSIS — J309 Allergic rhinitis, unspecified: Secondary | ICD-10-CM | POA: Diagnosis not present

## 2013-10-09 NOTE — Progress Notes (Signed)
Pre visit review using our clinic review tool, if applicable. No additional management support is needed unless otherwise documented below in the visit note. 

## 2013-10-09 NOTE — Progress Notes (Signed)
HPI  Pt presents to the office today with sinus congestion.Symptoms started yesterday. She endorses facial pain, pressure, headache, and tooth ache. She endorses watery eyes and clear nasal discharge She denies She has tried OTC Benadryl with no relief.   She also endorses concerns with her left sciatica. She has a back injury several years ago and often has issues with pain to this area. She stated this started yesterday after playing golf. She endorses pain that at times radiates down her left leg. She denies numbness or tingling. She did try ice last night with some relief.   Review of Systems    Past Medical History  Diagnosis Date  . Diverticulosis   . Diverticulitis   . Hypertension   . Hypothyroidism   . Osteopenia   . Arthritis   . Fibromyalgia   . Colon polyps   . Fatty liver   . Depression   . Spinal stenosis     Family History  Problem Relation Age of Onset  . Heart disease Father   . Bladder Cancer Father   . Hypertension Mother   . Kidney cancer Mother   . Colon cancer Neg Hx   . Stomach cancer Brother   . Prostate cancer Brother     History   Social History  . Marital Status: Married    Spouse Name: N/A    Number of Children: N/A  . Years of Education: N/A   Occupational History  . Not on file.   Social History Main Topics  . Smoking status: Never Smoker   . Smokeless tobacco: Never Used  . Alcohol Use: Yes     Comment: occasional  . Drug Use: No  . Sexual Activity: Not on file   Other Topics Concern  . Not on file   Social History Narrative  . No narrative on file    Allergies  Allergen Reactions  . Flagyl [Metronidazole]     Severe pain with diarrhea  . Codeine     REACTION: itching     Constitutional: Positive headache. Denies fatigue and fever. Denies abrupt weight changes.  HEENT:  Positive facial pressure, watery eyes, and nasal discharge and congestion.  Denies eye redness, ear pain, ringing in the ears, wax buildup, or  bloody nose. Respiratory:  Denies cough, difficulty breathing or shortness of breath.  Cardiovascular: Denies chest pain, chest tightness, palpitations or swelling in the hands or feet.   No other specific complaints in a complete review of systems (except as listed in HPI above).  Objective:    General: Appears his stated age, well developed, well nourished in NAD. HEENT: Head: normal shape and size, sinus tenderness noted; Eyes: watery bilateral with clear discharge; sclera white, no icterus, conjunctiva pink, PERRLA and EOMs intact; Ears: Left ear; clear serous fluid near TM; light reflex normal.  Tm's gray and intact, normal light reflex; Nose: mucosa pink and moist, septum midline; Throat/Mouth: + PND. Teeth present, mucosa pink and moist, no exudate noted, no lesions or ulcerations noted.  Neck: Neck supple, trachea midline. No massses, lumps or thyromegaly present.  Cardiovascular: Normal rate and rhythm. S1,S2 noted.  No murmur, rubs or gallops noted. No JVD or BLE edema. No carotid bruits noted. Pulmonary/Chest: Normal effort and positive vesicular breath sounds. No respiratory distress. No wheezes, rales or ronchi noted.      Assessment & Plan:   Allergic Rhinitis: Recommended OTC antihistamine daily: either Zyrtec or Claritin Rest and fluids  Left Sciatica:  Stretching exercises provided  Depo 80mg  IM given today Recommended taking Aleve or Ibuprofen for pain  Follow up if symptoms do not improve  Ressie Slevin, Demetrius Charity, Student-NP

## 2013-10-09 NOTE — Progress Notes (Signed)
HPI  Pt presents to the clinic today with c/o facial pain and pressure, nasal congestion, headache and tooth pain. She reports this started yesterday. She denies fever, chills or body aches.  She has taken Benadryl OTC without any relief. She has no history of allergies or breathing problems.   She also c/o of her left sciatica flaring up. She has had a back injury many years ago with occasional flare up. This started yesterday after playing golf. The pain does radiate down the left leg but she denies numbness. She did try ice with some relief last night.   Review of Systems    Past Medical History  Diagnosis Date  . Diverticulosis   . Diverticulitis   . Hypertension   . Hypothyroidism   . Osteopenia   . Arthritis   . Fibromyalgia   . Colon polyps   . Fatty liver   . Depression   . Spinal stenosis     Family History  Problem Relation Age of Onset  . Heart disease Father   . Bladder Cancer Father   . Hypertension Mother   . Kidney cancer Mother   . Colon cancer Neg Hx   . Stomach cancer Brother   . Prostate cancer Brother     History   Social History  . Marital Status: Married    Spouse Name: N/A    Number of Children: N/A  . Years of Education: N/A   Occupational History  . Not on file.   Social History Main Topics  . Smoking status: Never Smoker   . Smokeless tobacco: Never Used  . Alcohol Use: Yes     Comment: occasional  . Drug Use: No  . Sexual Activity: Not on file   Other Topics Concern  . Not on file   Social History Narrative  . No narrative on file    Allergies  Allergen Reactions  . Flagyl [Metronidazole]     Severe pain with diarrhea  . Codeine     REACTION: itching     Constitutional: Positive headache, fatigue and fever. Denies abrupt weight changes.  HEENT:  Positive eye pain, pressure behind the eyes, facial pain, nasal congestion and sore throat. Denies eye redness, ear pain, ringing in the ears, wax buildup, runny nose or bloody  nose. Respiratory: Positive cough. Denies difficulty breathing or shortness of breath.  Cardiovascular: Denies chest pain, chest tightness, palpitations or swelling in the hands or feet.  MSK: Pt reports low back pain radiating down left leg.  No other specific complaints in a complete review of systems (except as listed in HPI above).  Objective:  BP 130/82  Pulse 55  Temp(Src) 98.2 F (36.8 C) (Oral)  Wt 211 lb 8 oz (95.936 kg)  SpO2 98%   General: Appears her stated age, well developed, well nourished in NAD. HEENT: Head: normal shape and size, sinus tenderness noted; Eyes: sclera white, no icterus, conjunctiva pink, PERRLA and EOMs intact; Ears: Tm's gray and intact, normal light reflex; Nose: mucosa pink and moist, septum midline; Throat/Mouth: + PND. Teeth present, mucosa pink and moist, no exudate noted, no lesions or ulcerations noted.  Neck: Neck supple, trachea midline. No massses, lumps or thyromegaly present.  Cardiovascular: Normal rate and rhythm. S1,S2 noted.  No murmur, rubs or gallops noted. No JVD or BLE edema. No carotid bruits noted. Pulmonary/Chest: Normal effort and positive vesicular breath sounds. No respiratory distress. No wheezes, rales or ronchi noted.  MSK: Tenderness to palpation in the lumbar  spine. Positive straight leg raise.    Assessment & Plan:    Allergic Rhinitis  Take zyrtec and flonase OTC daily x 1-2 weeks 80 mg Depo IM today  Sciatica neuralgia, left:  Stretching exercises given 80 mg Depo IM today then naproxen thereafter as needed  RTC as needed or if symptoms persist.

## 2013-10-09 NOTE — Patient Instructions (Addendum)
Allergic Rhinitis Allergic rhinitis is when the mucous membranes in the nose respond to allergens. Allergens are particles in the air that cause your body to have an allergic reaction. This causes you to release allergic antibodies. Through a chain of events, these eventually cause you to release histamine into the blood stream. Although meant to protect the body, it is this release of histamine that causes your discomfort, such as frequent sneezing, congestion, and an itchy, runny nose.  CAUSES  Seasonal allergic rhinitis (hay fever) is caused by pollen allergens that may come from grasses, trees, and weeds. Year-round allergic rhinitis (perennial allergic rhinitis) is caused by allergens such as house dust mites, pet dander, and mold spores.  SYMPTOMS   Nasal stuffiness (congestion).  Itchy, runny nose with sneezing and tearing of the eyes. DIAGNOSIS  Your health care provider can help you determine the allergen or allergens that trigger your symptoms. If you and your health care provider are unable to determine the allergen, skin or blood testing may be used. TREATMENT  Allergic Rhinitis does not have a cure, but it can be controlled by:  Medicines and allergy shots (immunotherapy).  Avoiding the allergen. Hay fever may often be treated with antihistamines in pill or nasal spray forms. Antihistamines block the effects of histamine. There are over-the-counter medicines that may help with nasal congestion and swelling around the eyes. Check with your health care provider before taking or giving this medicine.  If avoiding the allergen or the medicine prescribed do not work, there are many new medicines your health care provider can prescribe. Stronger medicine may be used if initial measures are ineffective. Desensitizing injections can be used if medicine and avoidance does not work. Desensitization is when a patient is given ongoing shots until the body becomes less sensitive to the allergen.  Make sure you follow up with your health care provider if problems continue. HOME CARE INSTRUCTIONS It is not possible to completely avoid allergens, but you can reduce your symptoms by taking steps to limit your exposure to them. It helps to know exactly what you are allergic to so that you can avoid your specific triggers. SEEK MEDICAL CARE IF:   You have a fever.  You develop a cough that does not stop easily (persistent).  You have shortness of breath.  You start wheezing.  Symptoms interfere with normal daily activities. Document Released: 04/06/2001 Document Revised: 05/02/2013 Document Reviewed: 03/19/2013 [employer] Patient Information 2014 [employer].  

## 2013-10-25 DIAGNOSIS — M76899 Other specified enthesopathies of unspecified lower limb, excluding foot: Secondary | ICD-10-CM | POA: Diagnosis not present

## 2013-10-25 DIAGNOSIS — M545 Low back pain, unspecified: Secondary | ICD-10-CM | POA: Diagnosis not present

## 2013-10-25 DIAGNOSIS — IMO0002 Reserved for concepts with insufficient information to code with codable children: Secondary | ICD-10-CM | POA: Diagnosis not present

## 2013-12-10 ENCOUNTER — Encounter: Payer: Self-pay | Admitting: Family Medicine

## 2013-12-10 ENCOUNTER — Ambulatory Visit (INDEPENDENT_AMBULATORY_CARE_PROVIDER_SITE_OTHER): Payer: Medicare Other | Admitting: Family Medicine

## 2013-12-10 VITALS — BP 128/82 | HR 56 | Temp 98.6°F | Ht 64.0 in | Wt 213.0 lb

## 2013-12-10 DIAGNOSIS — R252 Cramp and spasm: Secondary | ICD-10-CM | POA: Insufficient documentation

## 2013-12-10 DIAGNOSIS — E039 Hypothyroidism, unspecified: Secondary | ICD-10-CM

## 2013-12-10 LAB — COMPREHENSIVE METABOLIC PANEL
ALBUMIN: 3.9 g/dL (ref 3.5–5.2)
ALT: 27 U/L (ref 0–35)
AST: 25 U/L (ref 0–37)
Alkaline Phosphatase: 76 U/L (ref 39–117)
BUN: 13 mg/dL (ref 6–23)
CALCIUM: 9.5 mg/dL (ref 8.4–10.5)
CHLORIDE: 105 meq/L (ref 96–112)
CO2: 30 meq/L (ref 19–32)
Creatinine, Ser: 0.9 mg/dL (ref 0.4–1.2)
GFR: 66.15 mL/min (ref >60.00)
GLUCOSE: 92 mg/dL (ref 70–99)
POTASSIUM: 4.4 meq/L (ref 3.5–5.1)
Sodium: 142 mEq/L (ref 135–145)
Total Bilirubin: 0.5 mg/dL (ref 0.2–1.2)
Total Protein: 6.9 g/dL (ref 6.0–8.3)

## 2013-12-10 LAB — TSH: TSH: 0.65 u[IU]/mL (ref 0.35–4.50)

## 2013-12-10 NOTE — Progress Notes (Signed)
Subjective:    Patient ID: Erin [name], female    DOB: 05/20/1946, 68 y.o.   MRN: [id number]  HPI Here for leg pain -both legs  Started 2 weeks ago "really bad cramps" in her legs --gets them primarily at night and then wakes up with soreness  She went to orthopedic dr and she was given shot in L hip for bursitis - did not help  Known back issues  Gave her meloxicam -- did not help   Low metabolism - she wants to check her thyroid - hx of hypothyroidism in past -not on supplementation now - has been subclinical Now sluggish and tired Feels like she does not burn calories  No goiter  Difficulty loosing wt     Aleve helps more - she knows it can raise bp so she takes it infrequently    Wonders if her K is low   Still plays a lot of golf - but does not tend to stretch No other regular exercise     Chemistry      Component Value Date/Time   NA 140 11/08/2012 1618   K 4.0 11/08/2012 1618   CL 104 11/08/2012 1618   CO2 28 11/08/2012 1618   BUN 12 11/08/2012 1618   CREATININE 0.8 11/08/2012 1618      Component Value Date/Time   CALCIUM 9.5 11/08/2012 1618   ALKPHOS 79 11/08/2012 1618   AST 44* 11/08/2012 1618   ALT 47* 11/08/2012 1618   BILITOT 0.5 11/08/2012 1618       Lab Results  Component Value Date   TSH 3.72 11/08/2012    Patient Active Problem List   Diagnosis Date Noted  . Leg cramps 12/10/2013  . Cough 08/17/2013  . DIVERTICULITIS-COLON 07/30/2009  . INTERNAL HEMORRHOIDS 06/10/2009  . FATTY LIVER DISEASE 06/10/2009  . LIVER FUNCTION TESTS, ABNORMAL, HX OF 06/10/2009  . Personal history of colonic polyps 06/10/2009  . GANGLION CYST 11/11/2008  . UNSPECIFIED DISORDER OF KIDNEY AND URETER 02/23/2008  . OBESITY 09/28/2007  . TRANSAMINASES, SERUM, ELEVATED 05/31/2007  . HYPOTHYROIDISM 05/30/2007  . HYPERTRIGLYCERIDEMIA 05/30/2007  . HYPERTENSION 05/30/2007  . DIVERTICULOSIS, COLON 05/30/2007  . 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 SPINAL STENOSIS, CERVICAL  05/30/2007  . FIBROMYALGIA 05/30/2007  . OSTEOPENIA 05/30/2007  . DEPRESSION, MAJOR, HX OF 05/30/2007   Past Medical History  Diagnosis Date  . Diverticulosis   . Diverticulitis   . Hypertension   . Hypothyroidism   . Osteopenia   . Arthritis   . Fibromyalgia   . Colon polyps   . Fatty liver   . Depression   . Spinal stenosis    Past Surgical History  Procedure Laterality Date  . Abdominal hysterectomy    . Tubal ligation    . Carpal tunnel repair    . Eyelid surgery    . Breast reduction surgery    . Liposuction trunk    . Colonoscopy    . Polypectomy     History  Substance Use Topics  . Smoking status: Never Smoker   . Smokeless tobacco: Never Used  . Alcohol Use: Yes     Comment: occasional   Family History  Problem Relation Age of Onset  . Heart disease Father   . Bladder Cancer Father   . Hypertension Mother   . Kidney cancer Mother   . Colon cancer Neg Hx   . Stomach cancer Brother   . Prostate cancer Brother    Allergies  Allergen Reactions  .  Flagyl [Metronidazole]     Severe pain with diarrhea  . Codeine     REACTION: itching   Current Outpatient Prescriptions on File Prior to Visit  Medication Sig Dispense Refill  . dicyclomine (BENTYL) 20 MG tablet TAKE 1 TABLET BY MOUTH 3 TIMES DAILY  90 tablet  2  . metoprolol (LOPRESSOR) 100 MG tablet Take 1 tablet (100 mg total) by mouth 2 (two) times daily.  60 tablet  11   No current facility-administered medications on file prior to visit.    Review of Systems Review of Systems  Constitutional: Negative for fever, appetite change, fatigue and unexpected weight change.  Eyes: Negative for pain and visual disturbance.  Respiratory: Negative for cough and shortness of breath.   Cardiovascular: Negative for cp or palpitations    Gastrointestinal: Negative for nausea, diarrhea and constipation.  Genitourinary: Negative for urgency and frequency.  Skin: Negative for pallor or rash   MSK pos for leg  pain and cramping bilaterally Neurological: Negative for weakness, light-headedness, numbness and headaches.  Hematological: Negative for adenopathy. Does not bruise/bleed easily.  Psychiatric/Behavioral: Negative for dysphoric mood. The patient is not nervous/anxious.         Objective:   Physical Exam  Constitutional: She appears well-developed and well-nourished. No distress.  obese and well appearing   Eyes: Conjunctivae and EOM are normal. No scleral icterus.  Neck: Normal range of motion. Neck supple. No JVD present. No thyromegaly present.  Cardiovascular: Normal rate, regular rhythm, normal heart sounds and intact distal pulses.  Exam reveals no gallop.   Pulmonary/Chest: Effort normal and breath sounds normal. No respiratory distress. She has no wheezes.  No crackles   Abdominal: Soft. Bowel sounds are normal. She exhibits no distension.  Musculoskeletal: She exhibits no edema and no tenderness.  Well perfused legs and feet w/o edema or tenderness  A few varicosities No palp cords or redness or warmth Nl rom ankles/knees   Lymphadenopathy:    She has no cervical adenopathy.  Neurological: She is alert. She has normal reflexes. No cranial nerve deficit. She exhibits normal muscle tone. Coordination normal.  Skin: Skin is warm and dry. No rash noted. No pallor.  Psychiatric: She has a normal mood and affect.          Assessment & Plan:

## 2013-12-10 NOTE — Patient Instructions (Signed)
For leg cramps - try 4-6 oz of diet tonic water each day  A teaspoon of mustard daily may help Also magnesium over the counter 250 mg daily (watch out for loose stools)  Do your stretches before and after exercise  Labs today to check electrolytes and thyroid  Take care of yourself

## 2013-12-10 NOTE — Assessment & Plan Note (Signed)
Subclinical in the past -no meds Pt is now feeling tired and sluggish tsh today

## 2013-12-10 NOTE — Assessment & Plan Note (Addendum)
Lab today for lytes  Disc various things to help like magnesium/tonic water (for quinine), mustard  Stretching demonstrated for calves - dynamic and static  Wt loss recommended

## 2013-12-10 NOTE — Progress Notes (Signed)
Pre visit review using our clinic review tool, if applicable. No additional management support is needed unless otherwise documented below in the visit note. 

## 2013-12-11 ENCOUNTER — Encounter: Payer: Self-pay | Admitting: *Deleted

## 2014-01-01 DIAGNOSIS — M5430 Sciatica, unspecified side: Secondary | ICD-10-CM | POA: Diagnosis not present

## 2014-01-01 DIAGNOSIS — M76899 Other specified enthesopathies of unspecified lower limb, excluding foot: Secondary | ICD-10-CM | POA: Diagnosis not present

## 2014-01-01 DIAGNOSIS — M9420 Chondromalacia, unspecified site: Secondary | ICD-10-CM | POA: Diagnosis not present

## 2014-01-01 DIAGNOSIS — M25569 Pain in unspecified knee: Secondary | ICD-10-CM | POA: Diagnosis not present

## 2014-01-06 DIAGNOSIS — S6390XA Sprain of unspecified part of unspecified wrist and hand, initial encounter: Secondary | ICD-10-CM | POA: Diagnosis not present

## 2014-01-06 DIAGNOSIS — IMO0002 Reserved for concepts with insufficient information to code with codable children: Secondary | ICD-10-CM | POA: Diagnosis not present

## 2014-01-06 DIAGNOSIS — S63659A Sprain of metacarpophalangeal joint of unspecified finger, initial encounter: Secondary | ICD-10-CM | POA: Diagnosis not present

## 2014-01-28 DIAGNOSIS — M545 Low back pain, unspecified: Secondary | ICD-10-CM | POA: Diagnosis not present

## 2014-01-31 DIAGNOSIS — M25559 Pain in unspecified hip: Secondary | ICD-10-CM | POA: Diagnosis not present

## 2014-01-31 DIAGNOSIS — IMO0002 Reserved for concepts with insufficient information to code with codable children: Secondary | ICD-10-CM | POA: Diagnosis not present

## 2014-03-11 ENCOUNTER — Other Ambulatory Visit: Payer: Self-pay | Admitting: Family Medicine

## 2014-03-12 ENCOUNTER — Other Ambulatory Visit: Payer: Self-pay | Admitting: Family Medicine

## 2014-03-12 NOTE — Telephone Encounter (Signed)
Pt request refill metoprolol by 03/13/14 to [facility]; advised pt can refill x 1 and then pt would need to schedule f/u appt. Pt said will ck schedule and cb for appt.

## 2014-04-03 ENCOUNTER — Ambulatory Visit (INDEPENDENT_AMBULATORY_CARE_PROVIDER_SITE_OTHER): Payer: Medicare Other | Admitting: Family Medicine

## 2014-04-03 ENCOUNTER — Ambulatory Visit: Payer: Medicare Other | Admitting: Family Medicine

## 2014-04-03 ENCOUNTER — Encounter: Payer: Self-pay | Admitting: Family Medicine

## 2014-04-03 VITALS — BP 128/86 | HR 57 | Temp 98.4°F | Ht 64.0 in | Wt 212.5 lb

## 2014-04-03 DIAGNOSIS — I10 Essential (primary) hypertension: Secondary | ICD-10-CM

## 2014-04-03 DIAGNOSIS — E669 Obesity, unspecified: Secondary | ICD-10-CM | POA: Diagnosis not present

## 2014-04-03 MED ORDER — METOPROLOL TARTRATE 100 MG PO TABS: ORAL_TABLET | ORAL | Status: DC

## 2014-04-03 NOTE — Assessment & Plan Note (Signed)
bp in fair control at this time  BP Readings from Last 1 Encounters:  04/03/14 128/86   No changes needed Disc lifstyle change with low sodium diet and exercise  Refilled metoprolol Rev lab from May  Enc wt loss with healthy diet and exercise

## 2014-04-03 NOTE — Progress Notes (Signed)
Subjective:    Patient ID: Erin [name], female    DOB: 1946-02-13, 68 y.o.   MRN: [id number]  HPI Here for f/u of chronic medical problems   Wt is stable  bmi of 36  Fatigued all the time -nothing new (that way for years) Wondered if she needed a vitamin   Is playing golf  Is very busy  No exercise for the sake of exercise   Hypothyroidism  Pt has no clinical changes No change in energy level/ hair or skin/ edema and no tremor Lab Results  Component Value Date   TSH 0.65 12/10/2013       Chemistry      Component Value Date/Time   NA 142 12/10/2013 1502   K 4.4 12/10/2013 1502   CL 105 12/10/2013 1502   CO2 30 12/10/2013 1502   BUN 13 12/10/2013 1502   CREATININE 0.9 12/10/2013 1502      Component Value Date/Time   CALCIUM 9.5 12/10/2013 1502   ALKPHOS 76 12/10/2013 1502   AST 25 12/10/2013 1502   ALT 27 12/10/2013 1502   BILITOT 0.5 12/10/2013 1502      Labs looked good   bp is stable today  No cp or palpitations or headaches or edema  No side effects to medicines  BP Readings from Last 3 Encounters:  04/03/14 128/86  12/10/13 128/82  10/09/13 130/82     Needs refill of metoprolol  Declines cholesterol check  She is eating healthy - and balanced diet   She takes dicyclomine when needed for abd cramping  Has not had a flare up in a long time  Sees Dr Olevia Perches- GI   Knows she needs to loose weight  Not very motivated to reduce portions  Also aware she needs to exercise more regularly  Patient Active Problem List   Diagnosis Date Noted  . Leg cramps 12/10/2013  . Cough 08/17/2013  . DIVERTICULITIS-COLON 07/30/2009  . INTERNAL HEMORRHOIDS 06/10/2009  . FATTY LIVER DISEASE 06/10/2009  . LIVER FUNCTION TESTS, ABNORMAL, HX OF 06/10/2009  . Personal history of colonic polyps 06/10/2009  . GANGLION CYST 11/11/2008  . UNSPECIFIED DISORDER OF KIDNEY AND URETER 02/23/2008  . OBESITY 09/28/2007  . TRANSAMINASES, SERUM, ELEVATED 05/31/2007  . HYPOTHYROIDISM  05/30/2007  . HYPERTRIGLYCERIDEMIA 05/30/2007  . HYPERTENSION 05/30/2007  . DIVERTICULOSIS, COLON 05/30/2007  . 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 SPINAL STENOSIS, CERVICAL 05/30/2007  . FIBROMYALGIA 05/30/2007  . OSTEOPENIA 05/30/2007  . DEPRESSION, MAJOR, HX OF 05/30/2007   Past Medical History  Diagnosis Date  . Diverticulosis   . Diverticulitis   . Hypertension   . Hypothyroidism   . Osteopenia   . Arthritis   . Fibromyalgia   . Colon polyps   . Fatty liver   . Depression   . Spinal stenosis    Past Surgical History  Procedure Laterality Date  . Abdominal hysterectomy    . Tubal ligation    . Carpal tunnel repair    . Eyelid surgery    . Breast reduction surgery    . Liposuction trunk    . Colonoscopy    . Polypectomy     History  Substance Use Topics  . Smoking status: Never Smoker   . Smokeless tobacco: Never Used  . Alcohol Use: Yes     Comment: occasional   Family History  Problem Relation Age of Onset  . Heart disease Father   . Bladder Cancer Father   . Hypertension Mother   .  Kidney cancer Mother   . Colon cancer Neg Hx   . Stomach cancer Brother   . Prostate cancer Brother    Allergies  Allergen Reactions  . Flagyl [Metronidazole]     Severe pain with diarrhea  . Codeine     REACTION: itching   Current Outpatient Prescriptions on File Prior to Visit  Medication Sig Dispense Refill  . dicyclomine (BENTYL) 20 MG tablet TAKE 1 TABLET BY MOUTH 3 TIMES DAILY  90 tablet  2  . metoprolol (LOPRESSOR) 100 MG tablet TAKE 1 TABLET BY MOUTH TWICE A DAY  60 tablet  2   No current facility-administered medications on file prior to visit.   Review of Systems Review of Systems  Constitutional: Negative for fever, appetite change,  and unexpected weight change.  Eyes: Negative for pain and visual disturbance.  Respiratory: Negative for cough and shortness of breath.   Cardiovascular: Negative for cp or palpitations    Gastrointestinal: Negative for  nausea, diarrhea and constipation.  Genitourinary: Negative for urgency and frequency.  Skin: Negative for pallor or rash   Neurological: Negative for weakness, light-headedness, numbness and headaches.  Hematological: Negative for adenopathy. Does not bruise/bleed easily.  Psychiatric/Behavioral: Negative for dysphoric mood. The patient is not nervous/anxious.   Pt declines health mt including flu vaccine        Objective:   Physical Exam  Constitutional: She appears well-developed and well-nourished. No distress.  obese and well appearing   HENT:  Head: Normocephalic and atraumatic.  Mouth/Throat: Oropharynx is clear and moist.  Eyes: Conjunctivae and EOM are normal. Pupils are equal, round, and reactive to light. No scleral icterus.  Neck: Normal range of motion. Neck supple. No JVD present. Carotid bruit is not present. No thyromegaly present.  Cardiovascular: Normal rate, regular rhythm, normal heart sounds and intact distal pulses.   No murmur heard. Pulmonary/Chest: Effort normal and breath sounds normal. No respiratory distress. She has no wheezes. She has no rales.  Abdominal: Soft. Bowel sounds are normal. She exhibits no distension and no mass. There is no tenderness.  Musculoskeletal: She exhibits no edema.  Lymphadenopathy:    She has no cervical adenopathy.  Neurological: She is alert. She has normal reflexes.  Skin: Skin is warm and dry. No rash noted.  Psychiatric: She has a normal mood and affect.          Assessment & Plan:   Problem List Items Addressed This Visit     Cardiovascular and Mediastinum   HYPERTENSION - Primary      bp in fair control at this time  BP Readings from Last 1 Encounters:  04/03/14 128/86   No changes needed Disc lifstyle change with low sodium diet and exercise  Refilled metoprolol Rev lab from May  Enc wt loss with healthy diet and exercise     Relevant Medications      metoprolol (LOPRESSOR) tablet     Other    OBESITY     Discussed how this problem influences overall health and the risks it imposes  Reviewed plan for weight loss with lower calorie diet (via better food choices and also portion control or program like weight watchers) and exercise building up to or more than 30 minutes 5 days per week including some aerobic activity

## 2014-04-03 NOTE — Assessment & Plan Note (Signed)
Discussed how this problem influences overall health and the risks it imposes  Reviewed plan for weight loss with lower calorie diet (via better food choices and also portion control or program like weight watchers) and exercise building up to or more than 30 minutes 5 days per week including some aerobic activity    

## 2014-04-03 NOTE — Progress Notes (Signed)
Pre visit review using our clinic review tool, if applicable. No additional management support is needed unless otherwise documented below in the visit note. 

## 2014-04-03 NOTE — Patient Instructions (Signed)
Try to get 1200-1500 mg of calcium per day with at least 1000 iu of vitamin D - for bone health  Continue current medication Blood pressure is well controlled  Keep working on healthy diet and exercise for weight loss

## 2014-05-14 ENCOUNTER — Encounter: Payer: Self-pay | Admitting: Family Medicine

## 2014-05-14 ENCOUNTER — Ambulatory Visit (INDEPENDENT_AMBULATORY_CARE_PROVIDER_SITE_OTHER): Payer: Medicare Other | Admitting: Family Medicine

## 2014-05-14 VITALS — BP 128/78 | HR 57 | Temp 98.4°F | Ht 64.0 in | Wt 212.0 lb

## 2014-05-14 DIAGNOSIS — J069 Acute upper respiratory infection, unspecified: Secondary | ICD-10-CM | POA: Insufficient documentation

## 2014-05-14 DIAGNOSIS — J040 Acute laryngitis: Secondary | ICD-10-CM | POA: Diagnosis not present

## 2014-05-14 DIAGNOSIS — B9789 Other viral agents as the cause of diseases classified elsewhere: Secondary | ICD-10-CM

## 2014-05-14 MED ORDER — BENZONATATE 200 MG PO CAPS: 200.0000 mg | ORAL_CAPSULE | Freq: Three times a day (TID) | ORAL | Status: DC | PRN

## 2014-05-14 NOTE — Progress Notes (Signed)
Pre visit review using our clinic review tool, if applicable. No additional management support is needed unless otherwise documented below in the visit note. 

## 2014-05-14 NOTE — Assessment & Plan Note (Signed)
Fluids and rest  Voice rest for hoarseness  Disc symptomatic care - see instructions on AVS  Tessalon for cough (can also try mucinex DM) Rev red flags for sinusitis Update if not starting to improve in a week or if worsening    Handout on viral uri given

## 2014-05-14 NOTE — Patient Instructions (Signed)
Drink lots of fluids Also rest  If fever or pain - you can try motrin or aleve otc  Nasal saline spray for congestion if needed Try tessalon for cough  In addition to that - you can also take mucinex DM (for cough and expectorant) Watch for facial pain/ green nasal drainage and fever - signs of sinus infection    Update if not starting to improve in a week or if worsening

## 2014-05-14 NOTE — Progress Notes (Signed)
Subjective:    Patient ID: Erin [name], female    DOB: 1946-01-04, 68 y.o.   MRN: [id number]  HPI Here with uri symptoms   Symptoms started on Sunday  Cough and some headache in the back of her head  Back of her throat is sore  Clear mucous  Nose is a little runny Tight in chest- ? If wheezing  No fever or chills   No otc meds  Is drinking lots of fluids   Per pt -this feels much different from a cold  Patient Active Problem List   Diagnosis Date Noted  . DIVERTICULITIS-COLON 07/30/2009  . INTERNAL HEMORRHOIDS 06/10/2009  . FATTY LIVER DISEASE 06/10/2009  . LIVER FUNCTION TESTS, ABNORMAL, HX OF 06/10/2009  . Personal history of colonic polyps 06/10/2009  . GANGLION CYST 11/11/2008  . UNSPECIFIED DISORDER OF KIDNEY AND URETER 02/23/2008  . OBESITY 09/28/2007  . TRANSAMINASES, SERUM, ELEVATED 05/31/2007  . HYPOTHYROIDISM 05/30/2007  . HYPERTRIGLYCERIDEMIA 05/30/2007  . HYPERTENSION 05/30/2007  . DIVERTICULOSIS, COLON 05/30/2007  . 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 SPINAL STENOSIS, CERVICAL 05/30/2007  . FIBROMYALGIA 05/30/2007  . OSTEOPENIA 05/30/2007  . DEPRESSION, MAJOR, HX OF 05/30/2007   Past Medical History  Diagnosis Date  . Diverticulosis   . Diverticulitis   . Hypertension   . Hypothyroidism   . Osteopenia   . Arthritis   . Fibromyalgia   . Colon polyps   . Fatty liver   . Depression   . Spinal stenosis    Past Surgical History  Procedure Laterality Date  . Abdominal hysterectomy    . Tubal ligation    . Carpal tunnel repair    . Eyelid surgery    . Breast reduction surgery    . Liposuction trunk    . Colonoscopy    . Polypectomy     History  Substance Use Topics  . Smoking status: Never Smoker   . Smokeless tobacco: Never Used  . Alcohol Use: Yes     Comment: occasional   Family History  Problem Relation Age of Onset  . Heart disease Father   . Bladder Cancer Father   . Hypertension Mother   . Kidney cancer Mother   .  Colon cancer Neg Hx   . Stomach cancer Brother   . Prostate cancer Brother    Allergies  Allergen Reactions  . Flagyl [Metronidazole]     Severe pain with diarrhea  . Codeine     REACTION: itching   Current Outpatient Prescriptions on File Prior to Visit  Medication Sig Dispense Refill  . dicyclomine (BENTYL) 20 MG tablet TAKE 1 TABLET BY MOUTH 3 TIMES DAILY  90 tablet  2  . metoprolol (LOPRESSOR) 100 MG tablet TAKE 1 TABLET BY MOUTH TWICE A DAY  60 tablet  11   No current facility-administered medications on file prior to visit.      Review of Systems    Review of Systems  Constitutional: Negative for fever, appetite change, and unexpected weight change.  ENT pos for cong and rhinorrhea , neg for sinus pain  Eyes: Negative for pain and visual disturbance.  Respiratory: Negative for shortness of breath.   Cardiovascular: Negative for cp or palpitations    Gastrointestinal: Negative for nausea, diarrhea and constipation.  Genitourinary: Negative for urgency and frequency.  Skin: Negative for pallor or rash   Neurological: Negative for weakness, light-headedness, numbness and headaches.  Hematological: Negative for adenopathy. Does not bruise/bleed easily.  Psychiatric/Behavioral: Negative for dysphoric  mood. The patient is not nervous/anxious.      Objective:   Physical Exam  Constitutional: She appears well-developed and well-nourished. No distress.  obese and well appearing   Hoarse voice  HENT:  Head: Normocephalic and atraumatic.  Right Ear: External ear normal.  Left Ear: External ear normal.  Mouth/Throat: Oropharynx is clear and moist. No oropharyngeal exudate.  Nares are injected and congested  No sinus tenderness Clear rhinorrhea  Throat clear   Eyes: Conjunctivae and EOM are normal. Pupils are equal, round, and reactive to light. Right eye exhibits no discharge. Left eye exhibits no discharge.  Neck: Normal range of motion. Neck supple.  Cardiovascular:  Normal rate, regular rhythm and normal heart sounds.   Pulmonary/Chest: Effort normal and breath sounds normal. No respiratory distress. She has no wheezes. She has no rales.  No rales or rhonchi  Lymphadenopathy:    She has no cervical adenopathy.  Skin: Skin is warm and dry. No rash noted.  Psychiatric: She has a normal mood and affect.          Assessment & Plan:   Problem List Items Addressed This Visit     Respiratory   Viral laryngitis - Primary     Disc symptomatic care - see instructions on AVS  Will rest voice  Also treat other viral symptoms  Tessalon for cough Update if not starting to improve in a week or if worsening      Viral URI with cough     Fluids and rest  Voice rest for hoarseness  Disc symptomatic care - see instructions on AVS  Tessalon for cough (can also try mucinex DM) Rev red flags for sinusitis Update if not starting to improve in a week or if worsening    Handout on viral uri given

## 2014-05-14 NOTE — Assessment & Plan Note (Signed)
Disc symptomatic care - see instructions on AVS  Will rest voice  Also treat other viral symptoms  Tessalon for cough Update if not starting to improve in a week or if worsening

## 2014-05-18 ENCOUNTER — Encounter (HOSPITAL_COMMUNITY): Payer: Self-pay | Admitting: Emergency Medicine

## 2014-05-18 ENCOUNTER — Emergency Department (HOSPITAL_COMMUNITY)
Admission: EM | Admit: 2014-05-18 | Discharge: 2014-05-18 | Disposition: A | Discharge status: Home / Self Care | Payer: Medicare Other | Attending: Emergency Medicine | Admitting: Emergency Medicine

## 2014-05-18 DIAGNOSIS — Z8739 Personal history of other diseases of the musculoskeletal system and connective tissue: Secondary | ICD-10-CM | POA: Diagnosis not present

## 2014-05-18 DIAGNOSIS — I10 Essential (primary) hypertension: Secondary | ICD-10-CM | POA: Diagnosis not present

## 2014-05-18 DIAGNOSIS — Z8639 Personal history of other endocrine, nutritional and metabolic disease: Secondary | ICD-10-CM | POA: Diagnosis not present

## 2014-05-18 DIAGNOSIS — Z8719 Personal history of other diseases of the digestive system: Secondary | ICD-10-CM | POA: Insufficient documentation

## 2014-05-18 DIAGNOSIS — Z8659 Personal history of other mental and behavioral disorders: Secondary | ICD-10-CM | POA: Diagnosis not present

## 2014-05-18 DIAGNOSIS — H9203 Otalgia, bilateral: Secondary | ICD-10-CM | POA: Diagnosis not present

## 2014-05-18 DIAGNOSIS — R05 Cough: Secondary | ICD-10-CM

## 2014-05-18 DIAGNOSIS — J029 Acute pharyngitis, unspecified: Secondary | ICD-10-CM | POA: Insufficient documentation

## 2014-05-18 DIAGNOSIS — Z792 Long term (current) use of antibiotics: Secondary | ICD-10-CM | POA: Diagnosis not present

## 2014-05-18 DIAGNOSIS — Z86010 Personal history of colonic polyps: Secondary | ICD-10-CM | POA: Insufficient documentation

## 2014-05-18 DIAGNOSIS — Z79899 Other long term (current) drug therapy: Secondary | ICD-10-CM | POA: Diagnosis not present

## 2014-05-18 DIAGNOSIS — R059 Cough, unspecified: Secondary | ICD-10-CM

## 2014-05-18 LAB — RAPID STREP SCREEN (MED CTR MEBANE ONLY): Streptococcus, Group A Screen (Direct): NEGATIVE

## 2014-05-18 MED ORDER — KETOROLAC TROMETHAMINE 60 MG/2ML IM SOLN
60.0000 mg | Freq: Once | INTRAMUSCULAR | Status: AC
  Administered 2014-05-18: 60 mg via INTRAMUSCULAR
  Filled 2014-05-18: qty 2

## 2014-05-18 MED ORDER — ALBUTEROL SULFATE HFA 108 (90 BASE) MCG/ACT IN AERS
2.0000 | INHALATION_SPRAY | Freq: Once | RESPIRATORY_TRACT | Status: AC
  Administered 2014-05-18: 2 via RESPIRATORY_TRACT
  Filled 2014-05-18: qty 6.7

## 2014-05-18 MED ORDER — AZITHROMYCIN 250 MG PO TABS: ORAL_TABLET | ORAL | Status: DC

## 2014-05-18 NOTE — ED Notes (Signed)
Pt c/o bilateral ear pain since yesterday. Pt states she feels like there is fluid in her ear.

## 2014-05-18 NOTE — ED Provider Notes (Signed)
CSN: [id number]     Arrival date & time 05/18/14  0135 History   First MD Initiated Contact with Patient 05/18/14 [phone]     Chief Complaint  Patient presents with  . Otalgia  . Sore Throat  . Cough     (Consider location/radiation/quality/duration/timing/severity/associated sxs/prior Treatment) HPI Comments: 68 year old female with history of lipids, high blood pressure, fibromyalgia, depression presents with sore throat, bodyaches, congestion, bilateral ear pain gradually worsening this week. Patient saw primary care Dr. and told likely viral process. Symptoms are worsening and she said she never gets sick. No documented fevers. No travel or sick contacts.  Patient is a 68 y.o. female presenting with ear pain, pharyngitis, and cough. The history is provided by the patient.  Otalgia Associated symptoms: cough and sore throat   Associated symptoms: no abdominal pain, no congestion, no fever, no neck pain, no rash and no vomiting   Sore Throat Pertinent negatives include no chest pain, no abdominal pain and no shortness of breath.  Cough Associated symptoms: ear pain and sore throat   Associated symptoms: no chest pain, no chills, no fever, no rash and no shortness of breath     Past Medical History  Diagnosis Date  . Diverticulosis   . Diverticulitis   . Hypertension   . Hypothyroidism   . Osteopenia   . Arthritis   . Fibromyalgia   . Colon polyps   . Fatty liver   . Depression   . Spinal stenosis    Past Surgical History  Procedure Laterality Date  . Abdominal hysterectomy    . Tubal ligation    . Carpal tunnel repair    . Eyelid surgery    . Breast reduction surgery    . Liposuction trunk    . Colonoscopy    . Polypectomy     Family History  Problem Relation Age of Onset  . Heart disease Father   . Bladder Cancer Father   . Hypertension Mother   . Kidney cancer Mother   . Colon cancer Neg Hx   . Stomach cancer Brother   . Prostate cancer Brother    History   Substance Use Topics  . Smoking status: Never Smoker   . Smokeless tobacco: Never Used  . Alcohol Use: Yes     Comment: occasional   OB History   Grav Para Term Preterm Abortions TAB SAB Ect Mult Living                 Review of Systems  Constitutional: Negative for fever and chills.  HENT: Positive for ear pain and sore throat. Negative for congestion.   Eyes: Negative for visual disturbance.  Respiratory: Positive for cough. Negative for shortness of breath.   Cardiovascular: Negative for chest pain.  Gastrointestinal: Negative for vomiting and abdominal pain.  Genitourinary: Negative for dysuria and flank pain.  Musculoskeletal: Positive for arthralgias. Negative for neck pain and neck stiffness.  Skin: Negative for rash.      Allergies  Flagyl and Codeine  Home Medications   Prior to Admission medications   Medication Sig Start Date End Date Taking? Authorizing Provider  dicyclomine (BENTYL) 20 MG tablet Take 20 mg by mouth 3 (three) times daily as needed for spasms.   Yes Historical Provider, MD  loratadine (CLARITIN) 10 MG tablet Take 10 mg by mouth once as needed for allergies.   Yes Historical Provider, MD  metoprolol (LOPRESSOR) 100 MG tablet Take 100 mg by mouth 2 (two) times daily.  Yes Historical Provider, MD  azithromycin (ZITHROMAX Z-PAK) 250 MG tablet 2 po day one, then 1 daily x 4 days 05/18/14   Mariea Clonts, MD   BP 172/86  Pulse 66  Temp(Src) 98.8 F (37.1 C) (Oral)  Resp 16  Ht 5\' 4"  (1.626 m)  Wt 210 lb (95.255 kg)  BMI 36.03 kg/m2  SpO2 95% Physical Exam  Nursing note and vitals reviewed. Constitutional: She is oriented to person, place, and time. She appears well-developed and well-nourished.  HENT:  Head: Normocephalic and atraumatic.  Mild posterior erythema mild dry mucous membranes No trismus, uvular deviation, unilateral posterior pharyngeal edema or submandibular swelling. Mild fluid behind both tympanic membranes, mild injection,  no drainage, no pain with moving bilateral ears.   Eyes: Right eye exhibits no discharge. Left eye exhibits no discharge.  Neck: Normal range of motion. Neck supple. No tracheal deviation present.  Cardiovascular: Normal rate and regular rhythm.   Pulmonary/Chest: Effort normal and breath sounds normal.  Abdominal: Soft. She exhibits no distension. There is no tenderness. There is no guarding.  Neurological: She is alert and oriented to person, place, and time.  Skin: Skin is warm. No rash noted.  Psychiatric: She has a normal mood and affect.    ED Course  Procedures (including critical care time) Labs Review Labs Reviewed  RAPID STREP SCREEN  CULTURE, GROUP A STREP    Imaging Review No results found.   EKG Interpretation None      MDM   Final diagnoses:  Cough  Sore throat  Ear pain, bilateral   Clinically patient presents with multiple symptoms likely viral process however with worsening symptoms discussed strep test and chest x-ray. Well appearing, clinically patient has signs of ear infection, throat infection and is comfortable holding on chest x-ray and treating symptomatically and prophylactically with antibiotics. Discussed outpatient followup.  Results and differential diagnosis were discussed with the patient/parent/guardian. Close follow up outpatient was discussed, comfortable with the plan.   Medications  albuterol (PROVENTIL HFA;VENTOLIN HFA) 108 (90 BASE) MCG/ACT inhaler 2 puff (2 puffs Inhalation Given 05/18/14 0425)  ketorolac (TORADOL) injection 60 mg (60 mg Intramuscular Given 05/18/14 0424)    Filed Vitals:   05/18/14 0141 05/18/14 0402 05/18/14 0415  BP: 180/89 172/87 172/86  Pulse: 84 68 66  Temp: 98.8 F (37.1 C)    TempSrc: Oral    Resp: 16 16   Height: 5\' 4"  (1.626 m)    Weight: 210 lb (95.255 kg)    SpO2: 97% 96% 95%    Final diagnoses:  Cough  Sore throat  Ear pain, bilateral        Mariea Clonts, MD 05/18/14 [phone]

## 2014-05-18 NOTE — ED Notes (Signed)
Dr. Reather Converse in to see the patient.

## 2014-05-18 NOTE — ED Notes (Signed)
"  Still feels like fluid sloshing around in ears, and sounds like I am talking in a tunnel"

## 2014-05-18 NOTE — Discharge Instructions (Signed)
If you were given medicines take as directed.  If you are on coumadin or contraceptives realize their levels and effectiveness is altered by many different medicines.  If you have any reaction (rash, tongues swelling, other) to the medicines stop taking and see a physician.   Have blood pressure rechecked.  Please follow up as directed and return to the ER or see a physician for new or worsening symptoms.  Thank you. Filed Vitals:   05/18/14 0141 05/18/14 0402 05/18/14 0415  BP: 180/89 172/87 172/86  Pulse: 84 68 66  Temp: 98.8 F (37.1 C)    TempSrc: Oral    Resp: 16 16   Height: 5\' 4"  (1.626 m)    Weight: 210 lb (95.255 kg)    SpO2: 97% 96% 95%

## 2014-05-18 NOTE — ED Notes (Signed)
D/c'd by CA, RN.  Pt left/forgot albuterol HFA and spacer.

## 2014-05-20 ENCOUNTER — Ambulatory Visit (INDEPENDENT_AMBULATORY_CARE_PROVIDER_SITE_OTHER): Payer: Medicare Other | Admitting: Family Medicine

## 2014-05-20 ENCOUNTER — Encounter: Payer: Self-pay | Admitting: Family Medicine

## 2014-05-20 VITALS — BP 142/84 | HR 70 | Temp 99.3°F | Ht 64.0 in | Wt 208.5 lb

## 2014-05-20 DIAGNOSIS — H6503 Acute serous otitis media, bilateral: Secondary | ICD-10-CM

## 2014-05-20 DIAGNOSIS — H6690 Otitis media, unspecified, unspecified ear: Secondary | ICD-10-CM | POA: Insufficient documentation

## 2014-05-20 LAB — CULTURE, GROUP A STREP

## 2014-05-20 MED ORDER — AMOXICILLIN-POT CLAVULANATE 875-125 MG PO TABS: 1.0000 | ORAL_TABLET | Freq: Two times a day (BID) | ORAL | Status: DC

## 2014-05-20 NOTE — Patient Instructions (Addendum)
Stop the zithromax  Take augmentin as directed  Drink lots of fluids Get flonase nasal spray over the counter and use as directed for 2 weeks to open up your ears  If not improving in the next week-please let me know   If you develop a rash around ear or face let me know right away

## 2014-05-20 NOTE — Progress Notes (Signed)
Subjective:    Patient ID: Erin [name], female    DOB: 1945-10-30, 68 y.o.   MRN: [id number]  HPI Here for f/u of URI symptoms   Seen here-dx with viral process Went to ER on sat - RST neg -cx pend  Exam reassuring   Given proventil and toradol  Also zpack   Has 2 more days of zpack and not well   Her R ear is still full and hurts  Throat is fine now   Cough is improved  Nasal symptoms are improved   Patient Active Problem List   Diagnosis Date Noted  . Viral laryngitis 05/14/2014  . Viral URI with cough 05/14/2014  . DIVERTICULITIS-COLON 07/30/2009  . INTERNAL HEMORRHOIDS 06/10/2009  . FATTY LIVER DISEASE 06/10/2009  . LIVER FUNCTION TESTS, ABNORMAL, HX OF 06/10/2009  . Personal history of colonic polyps 06/10/2009  . GANGLION CYST 11/11/2008  . UNSPECIFIED DISORDER OF KIDNEY AND URETER 02/23/2008  . OBESITY 09/28/2007  . TRANSAMINASES, SERUM, ELEVATED 05/31/2007  . HYPOTHYROIDISM 05/30/2007  . HYPERTRIGLYCERIDEMIA 05/30/2007  . HYPERTENSION 05/30/2007  . DIVERTICULOSIS, COLON 05/30/2007  . 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 SPINAL STENOSIS, CERVICAL 05/30/2007  . FIBROMYALGIA 05/30/2007  . OSTEOPENIA 05/30/2007  . DEPRESSION, MAJOR, HX OF 05/30/2007   Past Medical History  Diagnosis Date  . Diverticulosis   . Diverticulitis   . Hypertension   . Hypothyroidism   . Osteopenia   . Arthritis   . Fibromyalgia   . Colon polyps   . Fatty liver   . Depression   . Spinal stenosis    Past Surgical History  Procedure Laterality Date  . Abdominal hysterectomy    . Tubal ligation    . Carpal tunnel repair    . Eyelid surgery    . Breast reduction surgery    . Liposuction trunk    . Colonoscopy    . Polypectomy     History  Substance Use Topics  . Smoking status: Never Smoker   . Smokeless tobacco: Never Used  . Alcohol Use: Yes     Comment: occasional   Family History  Problem Relation Age of Onset  . Heart disease Father   . Bladder Cancer  Father   . Hypertension Mother   . Kidney cancer Mother   . Colon cancer Neg Hx   . Stomach cancer Brother   . Prostate cancer Brother    Allergies  Allergen Reactions  . Flagyl [Metronidazole] Diarrhea    Severe pain also.  . Codeine Itching   Current Outpatient Prescriptions on File Prior to Visit  Medication Sig Dispense Refill  . azithromycin (ZITHROMAX Z-PAK) 250 MG tablet 2 po day one, then 1 daily x 4 days  5 tablet  0  . dicyclomine (BENTYL) 20 MG tablet Take 20 mg by mouth 3 (three) times daily as needed for spasms.      Marland Kitchen loratadine (CLARITIN) 10 MG tablet Take 10 mg by mouth once as needed for allergies.      . metoprolol (LOPRESSOR) 100 MG tablet Take 100 mg by mouth 2 (two) times daily.       No current facility-administered medications on file prior to visit.      Review of Systems Review of Systems  Constitutional: Negative for fever, appetite change,  and unexpected weight change.  ENT pos for cong and rhinorrhea and ear pain  Eyes: Negative for pain and visual disturbance.  Respiratory: Negative for wheeze and shortness of breath.  Cardiovascular: Negative for cp or palpitations    Gastrointestinal: Negative for nausea, diarrhea and constipation.  Genitourinary: Negative for urgency and frequency.  Skin: Negative for pallor or rash   Neurological: Negative for weakness, light-headedness, numbness and headaches.  Hematological: Negative for adenopathy. Does not bruise/bleed easily.  Psychiatric/Behavioral: Negative for dysphoric mood. The patient is not nervous/anxious.         Objective:   Physical Exam  Constitutional: She appears well-developed and well-nourished. No distress.  obese and well appearing   HENT:  Head: Normocephalic and atraumatic.  Mouth/Throat: Oropharynx is clear and moist. No oropharyngeal exudate.  Nares are injected and congested  Mild maxillary sinus tenderness TMs are erythematous with effusions bilaterally   No rash/ skin  change noted   Eyes: Conjunctivae and EOM are normal. Pupils are equal, round, and reactive to light. Right eye exhibits no discharge. Left eye exhibits no discharge. No scleral icterus.  Neck: Normal range of motion. Neck supple.  Cardiovascular: Normal rate, regular rhythm, normal heart sounds and intact distal pulses.  Exam reveals no gallop.   Pulmonary/Chest: Effort normal and breath sounds normal. No respiratory distress. She has no wheezes. She has no rales.  Lymphadenopathy:    She has no cervical adenopathy.  Neurological: She is alert.  Skin: Skin is warm and dry. No rash noted. No erythema.  Psychiatric: She has a normal mood and affect.          Assessment & Plan:   Problem List Items Addressed This Visit     Nervous and Auditory   Otitis media - Primary     TMs are erythematous bilaterally with eff tx with augmentin (stop the zpack) flonase otc for ETD symptoms  Analgesics and warm compresses prn  Update if not starting to improve in a week or if worsening  -would consider ENT consult     Relevant Medications      amoxicillin-clavulanate (AUGMENTIN) tablet 875-125 mg

## 2014-05-20 NOTE — Assessment & Plan Note (Signed)
TMs are erythematous bilaterally with eff tx with augmentin (stop the zpack) flonase otc for ETD symptoms  Analgesics and warm compresses prn  Update if not starting to improve in a week or if worsening  -would consider ENT consult

## 2014-05-20 NOTE — Progress Notes (Signed)
Pre visit review using our clinic review tool, if applicable. No additional management support is needed unless otherwise documented below in the visit note. 

## 2014-05-22 ENCOUNTER — Telehealth: Payer: Self-pay

## 2014-05-22 DIAGNOSIS — H6503 Acute serous otitis media, bilateral: Secondary | ICD-10-CM

## 2014-05-22 DIAGNOSIS — H66003 Acute suppurative otitis media without spontaneous rupture of ear drum, bilateral: Secondary | ICD-10-CM | POA: Diagnosis not present

## 2014-05-22 NOTE — Telephone Encounter (Signed)
Sounds like she got an ENT appt today, thanks

## 2014-05-22 NOTE — Telephone Encounter (Signed)
Pt was seen 05/20/14; pt taking antibiotic for 2 days and pt is no better; both ears hurt and lt ear pt cannot hear out of. No fever. Pt request cb.[facility]

## 2014-05-22 NOTE — Telephone Encounter (Signed)
I will place an urgent referral to ENT

## 2014-05-22 NOTE — Telephone Encounter (Signed)
Pt notified of ENT referral, pt advise me that her sxs are worsening and that she is really dizzy and nauseous. I advise Dr. Glori Bickers and she told me to tell pt that if Rosaria Ferries can't get appt scheduled with ENT within the next hr., then pt needs to go to ER, pt verbalized understanding

## 2014-05-27 ENCOUNTER — Telehealth: Payer: Self-pay

## 2014-05-27 MED ORDER — PREDNISONE 10 MG PO TABS: ORAL_TABLET | ORAL | Status: DC

## 2014-05-27 NOTE — Telephone Encounter (Signed)
Pt notified Rx sent and to let ENT know about her sxs

## 2014-05-27 NOTE — Telephone Encounter (Signed)
Pt was seen 05/20/14; pt is still taking amoxicillin and still having bilateral earache; pt request prednisone sent to [facility]. Pt going out of town and request cb ASAP.  Pt saw ENT on 05/24/14 and was not given any medication; was told should be better in next couple of days. Pt has not called ENT office back. Pt had temp last night 100.5 and this morning 99.7. Pt request cb.

## 2014-05-27 NOTE — Telephone Encounter (Signed)
I will send in prednisone  She must call ENT and let them know about her symptoms as well

## 2014-06-19 DIAGNOSIS — J0100 Acute maxillary sinusitis, unspecified: Secondary | ICD-10-CM | POA: Diagnosis not present

## 2014-06-19 DIAGNOSIS — H6983 Other specified disorders of Eustachian tube, bilateral: Secondary | ICD-10-CM | POA: Diagnosis not present

## 2014-07-12 DIAGNOSIS — H6123 Impacted cerumen, bilateral: Secondary | ICD-10-CM | POA: Diagnosis not present

## 2014-07-12 DIAGNOSIS — H6983 Other specified disorders of Eustachian tube, bilateral: Secondary | ICD-10-CM | POA: Diagnosis not present

## 2014-08-07 DIAGNOSIS — N760 Acute vaginitis: Secondary | ICD-10-CM | POA: Diagnosis not present

## 2014-08-07 DIAGNOSIS — Z01419 Encounter for gynecological examination (general) (routine) without abnormal findings: Secondary | ICD-10-CM | POA: Diagnosis not present

## 2014-09-02 DIAGNOSIS — M25562 Pain in left knee: Secondary | ICD-10-CM | POA: Diagnosis not present

## 2014-11-13 ENCOUNTER — Telehealth: Payer: Self-pay | Admitting: Internal Medicine

## 2014-11-13 NOTE — Telephone Encounter (Signed)
Spoke with patient and she states she had right hip pain last night and then had nausea. She states she was worried that it was related to her intestines. Patient informed that abdominal pain is usually associated with nausea not hip pain. She will call her PCP if she has this again.

## 2014-12-13 DIAGNOSIS — H40013 Open angle with borderline findings, low risk, bilateral: Secondary | ICD-10-CM | POA: Diagnosis not present

## 2014-12-16 DIAGNOSIS — H40013 Open angle with borderline findings, low risk, bilateral: Secondary | ICD-10-CM | POA: Diagnosis not present

## 2015-02-07 DIAGNOSIS — L919 Hypertrophic disorder of the skin, unspecified: Secondary | ICD-10-CM | POA: Diagnosis not present

## 2015-02-07 DIAGNOSIS — L820 Inflamed seborrheic keratosis: Secondary | ICD-10-CM | POA: Diagnosis not present

## 2015-02-07 DIAGNOSIS — L821 Other seborrheic keratosis: Secondary | ICD-10-CM | POA: Diagnosis not present

## 2015-02-07 DIAGNOSIS — D485 Neoplasm of uncertain behavior of skin: Secondary | ICD-10-CM | POA: Diagnosis not present

## 2015-02-20 ENCOUNTER — Encounter: Payer: Self-pay | Admitting: Family Medicine

## 2015-03-19 ENCOUNTER — Encounter: Payer: Self-pay | Admitting: Internal Medicine

## 2015-03-19 ENCOUNTER — Ambulatory Visit (INDEPENDENT_AMBULATORY_CARE_PROVIDER_SITE_OTHER): Payer: Medicare Other | Admitting: Internal Medicine

## 2015-03-19 VITALS — BP 170/90 | HR 56 | Ht 62.5 in | Wt 207.1 lb

## 2015-03-19 DIAGNOSIS — R1032 Left lower quadrant pain: Secondary | ICD-10-CM | POA: Diagnosis not present

## 2015-03-19 NOTE — Progress Notes (Signed)
Erin [name] [birth year] [id number]  Note: This dictation was prepared with Dragon digital system. Any transcriptional errors that result from this procedure are unintentional.   History of Present Illness: This is a 69 yo WF, with hx of diverticulitis, who has a persistent pain in her sacroiliac area on daily basis, which does not come on until she lays down at night. She is an avid Animator, and the pain  At times bothers her when she swings, also when she comes home from playing golf. The pain would not radiate anteriorly and there has been no change in her bowl habits    Past Medical History  Diagnosis Date  . Diverticulosis   . Diverticulitis   . Hypertension   . Hypothyroidism   . Osteopenia   . Arthritis   . Fibromyalgia   . Colon polyps   . Fatty liver   . Depression   . Spinal stenosis     Past Surgical History  Procedure Laterality Date  . Abdominal hysterectomy    . Tubal ligation    . Carpal tunnel repair    . Eyelid surgery    . Breast reduction surgery    . Liposuction trunk    . Colonoscopy    . Polypectomy      Allergies  Allergen Reactions  . Flagyl [Metronidazole] Diarrhea    Severe pain also.  . Codeine Itching    Family history and social history have been reviewed.  Review of Systems: L nip and SI pain at night  The remainder of the 10 point ROS is negative except as outlined in the H&P  Physical Exam: General Appearance Well developed, in no distress Eyes  Non icteric  HEENT  Non traumatic, normocephalic  Mouth No lesion, tongue papillated, no cheilosis Neck Supple without adenopathy, thyroid not enlarged, no carotid bruits, no JVD Lungs Clear to auscultation bilaterally COR Normal S1, normal S2, regular rhythm, no murmur, quiet precordium Abdomen LLQ normal, straight leg raising not painful, tender left iliac crest. Rectal not done Extremities  No pedal edema Skin No lesions Neurological Alert and oriented x 3 Psychological  Normal mood and affect  Assessment and Plan:   MS pain left hip/pelvis area, not a GI related pain. Rec to follow up with her orthopedist. Start Tramadol 50 mg,hs  Delfin Edis 03/19/2015

## 2015-03-19 NOTE — Patient Instructions (Signed)
Dr Loura Pardon

## 2015-03-20 MED ORDER — TRAMADOL HCL 50 MG PO TABS
50.0000 mg | ORAL_TABLET | Freq: Every day | ORAL | Status: DC
Start: 2015-03-20 — End: 2015-04-23

## 2015-03-20 NOTE — Addendum Note (Signed)
Addended by: Audrea Muscat on: 03/20/2015 09:08 AM   Modules accepted: Orders

## 2015-04-04 ENCOUNTER — Other Ambulatory Visit: Payer: Self-pay | Admitting: Family Medicine

## 2015-04-04 NOTE — Telephone Encounter (Signed)
Called pt and no answer and no voicemail (kept ringing) 

## 2015-04-04 NOTE — Telephone Encounter (Signed)
Please schedule a fall f/u and refill until then  

## 2015-04-04 NOTE — Telephone Encounter (Signed)
Electronic refill request, no recent/future appt., please advise  

## 2015-04-08 NOTE — Telephone Encounter (Signed)
appt scheduled and med refilled 

## 2015-04-23 ENCOUNTER — Ambulatory Visit (INDEPENDENT_AMBULATORY_CARE_PROVIDER_SITE_OTHER): Payer: Medicare Other | Admitting: Family Medicine

## 2015-04-23 ENCOUNTER — Encounter: Payer: Self-pay | Admitting: Family Medicine

## 2015-04-23 VITALS — BP 118/74 | HR 52 | Temp 98.4°F | Ht 62.5 in | Wt 207.0 lb

## 2015-04-23 DIAGNOSIS — E039 Hypothyroidism, unspecified: Secondary | ICD-10-CM

## 2015-04-23 DIAGNOSIS — E669 Obesity, unspecified: Secondary | ICD-10-CM

## 2015-04-23 DIAGNOSIS — I10 Essential (primary) hypertension: Secondary | ICD-10-CM | POA: Diagnosis not present

## 2015-04-23 LAB — COMPREHENSIVE METABOLIC PANEL
ALT: 32 U/L (ref 0–35)
AST: 31 U/L (ref 0–37)
Albumin: 4.2 g/dL (ref 3.5–5.2)
Alkaline Phosphatase: 86 U/L (ref 39–117)
BILIRUBIN TOTAL: 0.6 mg/dL (ref 0.2–1.2)
BUN: 13 mg/dL (ref 6–23)
CO2: 31 mEq/L (ref 19–32)
CREATININE: 0.79 mg/dL (ref 0.40–1.20)
Calcium: 10.1 mg/dL (ref 8.4–10.5)
Chloride: 104 mEq/L (ref 96–112)
GFR: 76.58 mL/min (ref >60.00)
GLUCOSE: 94 mg/dL (ref 70–99)
Potassium: 4.6 mEq/L (ref 3.5–5.1)
Sodium: 140 mEq/L (ref 135–145)
Total Protein: 7.1 g/dL (ref 6.0–8.3)

## 2015-04-23 LAB — CBC WITH DIFFERENTIAL/PLATELET
BASOS ABS: 0 10*3/uL (ref 0.0–0.1)
Basophils Relative: 0.8 % (ref 0.0–3.0)
Eosinophils Absolute: 0.2 10*3/uL (ref 0.0–0.7)
Eosinophils Relative: 3.1 % (ref 0.0–5.0)
HCT: 45.6 % (ref 36.0–46.0)
Hemoglobin: 15.3 g/dL — ABNORMAL HIGH (ref 12.0–15.0)
LYMPHS ABS: 2.6 10*3/uL (ref 0.7–4.0)
Lymphocytes Relative: 40.8 % (ref 12.0–46.0)
MCHC: 33.6 g/dL (ref 30.0–36.0)
MCV: 91 fl (ref 78.0–100.0)
Monocytes Absolute: 0.6 10*3/uL (ref 0.1–1.0)
Monocytes Relative: 9 % (ref 3.0–12.0)
NEUTROS ABS: 3 10*3/uL (ref 1.4–7.7)
NEUTROS PCT: 46.3 % (ref 43.0–77.0)
Platelets: 241 10*3/uL (ref 150.0–400.0)
RBC: 5.01 Mil/uL (ref 3.87–5.11)
RDW: 13.3 % (ref 11.5–15.5)
WBC: 6.4 10*3/uL (ref 4.0–10.5)

## 2015-04-23 LAB — LIPID PANEL
CHOL/HDL RATIO: 4
Cholesterol: 175 mg/dL (ref 0–200)
HDL: 44.9 mg/dL (ref >39.00)
NONHDL: 130.2
TRIGLYCERIDES: 236 mg/dL — AB (ref 0.0–149.0)
VLDL: 47.2 mg/dL — ABNORMAL HIGH (ref 0.0–40.0)

## 2015-04-23 LAB — LDL CHOLESTEROL, DIRECT: LDL DIRECT: 110 mg/dL

## 2015-04-23 LAB — TSH: TSH: 2.93 u[IU]/mL (ref 0.35–4.50)

## 2015-04-23 MED ORDER — METOPROLOL TARTRATE 100 MG PO TABS: 100.0000 mg | ORAL_TABLET | Freq: Two times a day (BID) | ORAL | Status: DC

## 2015-04-23 NOTE — Progress Notes (Signed)
Pre visit review using our clinic review tool, if applicable. No additional management support is needed unless otherwise documented below in the visit note. 

## 2015-04-23 NOTE — Patient Instructions (Addendum)
Labs today  Blood pressure is in good control  Labs today   Ears look good today   Take care of yourself  Think about a weight loss effort or program

## 2015-04-23 NOTE — Progress Notes (Signed)
Subjective:    Patient ID: Erin [name], female    DOB: 10-11-1945, 69 y.o.   MRN: [id number]  HPI Here for f/u of chronic medical problems   Still getting hearing back in her ears from her viral laryngitis  Took forever to get her hearing back  Went to ENT - and it  Still some dullness in L ear - but improved  Took a whole year to get better    Wt is stable bmi of 37- obese range Is taking care of herself  Golfing for exercise  Eating a healthy diet   Is frustrated by weight and discomfort from it     Pt declines health mt   bp is stable today  No cp or palpitations or headaches or edema  No side effects to medicines  BP Readings from Last 3 Encounters:  04/23/15 118/74  03/19/15 170/90  05/20/14 142/84     Hx of subclinical hypothyroidism Lab Results  Component Value Date   TSH 0.65 12/10/2013   energy level is so/so - more effort to do things    Hx of elevated lft and fatty liver Lab Results  Component Value Date   ALT 27 12/10/2013   AST 25 12/10/2013   ALKPHOS 76 12/10/2013   BILITOT 0.5 12/10/2013    Ok last check   Hx of hypertriglyceridemia Lab Results  Component Value Date   CHOL 190 11/08/2012   HDL 39.30 11/08/2012   LDLCALC 101* 08/22/2009   LDLDIRECT 117.5 11/08/2012   TRIG 220.0* 11/08/2012   CHOLHDL 5 11/08/2012    Joints hurt more lately  Esp in shoulders Also L groin   Patient Active Problem List   Diagnosis Date Noted  . Otitis media 05/20/2014  . DIVERTICULITIS-COLON 07/30/2009  . INTERNAL HEMORRHOIDS 06/10/2009  . FATTY LIVER DISEASE 06/10/2009  . LIVER FUNCTION TESTS, ABNORMAL, HX OF 06/10/2009  . Personal history of colonic polyps 06/10/2009  . GANGLION CYST 11/11/2008  . UNSPECIFIED DISORDER OF KIDNEY AND URETER 02/23/2008  . Obesity 09/28/2007  . TRANSAMINASES, SERUM, ELEVATED 05/31/2007  . Hypothyroidism 05/30/2007  . HYPERTRIGLYCERIDEMIA 05/30/2007  . Essential hypertension 05/30/2007  . DIVERTICULOSIS,  COLON 05/30/2007  . 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 SPINAL STENOSIS, CERVICAL 05/30/2007  . FIBROMYALGIA 05/30/2007  . OSTEOPENIA 05/30/2007  . DEPRESSION, MAJOR, HX OF 05/30/2007   Past Medical History  Diagnosis Date  . Diverticulosis   . Diverticulitis   . Hypertension   . Hypothyroidism   . Osteopenia   . Arthritis   . Fibromyalgia   . Colon polyps   . Fatty liver   . Depression   . Spinal stenosis    Past Surgical History  Procedure Laterality Date  . Abdominal hysterectomy    . Tubal ligation    . Carpal tunnel repair    . Eyelid surgery    . Breast reduction surgery    . Liposuction trunk    . Colonoscopy    . Polypectomy     Social History  Substance Use Topics  . Smoking status: Never Smoker   . Smokeless tobacco: Never Used  . Alcohol Use: 0.0 oz/week    0 Standard drinks or equivalent per week     Comment: occasional   Family History  Problem Relation Age of Onset  . Heart disease Father   . Bladder Cancer Father   . Hypertension Mother   . Kidney cancer Mother   . Colon cancer Neg Hx   . Stomach  cancer Brother   . Prostate cancer Brother    Allergies  Allergen Reactions  . Flagyl [Metronidazole] Diarrhea    Severe pain also.  . Codeine Itching   Current Outpatient Prescriptions on File Prior to Visit  Medication Sig Dispense Refill  . dicyclomine (BENTYL) 20 MG tablet Take 20 mg by mouth as needed for spasms.     . metoprolol (LOPRESSOR) 100 MG tablet TAKE 1 TABLET BY MOUTH TWICE A DAY 60 tablet 0   No current facility-administered medications on file prior to visit.     Review of Systems Review of Systems  Constitutional: Negative for fever, appetite change, fatigue and unexpected weight change.  Eyes: Negative for pain and visual disturbance.  Respiratory: Negative for cough and shortness of breath.   Cardiovascular: Negative for cp or palpitations    Gastrointestinal: Negative for nausea, diarrhea and constipation.    Genitourinary: Negative for urgency and frequency.  Skin: Negative for pallor or rash   Neurological: Negative for weakness, light-headedness, numbness and headaches.  Hematological: Negative for adenopathy. Does not bruise/bleed easily.  Psychiatric/Behavioral: Negative for dysphoric mood. The patient is not nervous/anxious.         Objective:   Physical Exam  Constitutional: She appears well-developed and well-nourished. No distress.  obese and well appearing   HENT:  Head: Normocephalic and atraumatic.  Right Ear: External ear normal.  Left Ear: External ear normal.  Mouth/Throat: Oropharynx is clear and moist.  Eyes: Conjunctivae and EOM are normal. Pupils are equal, round, and reactive to light.  Neck: Normal range of motion. Neck supple. No JVD present. Carotid bruit is not present. No thyromegaly present.  Cardiovascular: Normal rate, regular rhythm, normal heart sounds and intact distal pulses.  Exam reveals no gallop.   Pulmonary/Chest: Effort normal and breath sounds normal. No respiratory distress. She has no wheezes. She has no rales.  No crackles  Abdominal: Soft. Bowel sounds are normal. She exhibits no distension, no abdominal bruit and no mass. There is no tenderness.  Musculoskeletal: She exhibits no edema.  Lymphadenopathy:    She has no cervical adenopathy.  Neurological: She is alert. She has normal reflexes.  Skin: Skin is warm and dry. No rash noted.  Psychiatric: She has a normal mood and affect.          Assessment & Plan:   Problem List Items Addressed This Visit      Cardiovascular and Mediastinum   Essential hypertension - Primary    bp in fair control at this time  BP Readings from Last 1 Encounters:  04/23/15 118/74   No changes needed Disc lifstyle change with low sodium diet and exercise  Labs today      Relevant Medications   metoprolol (LOPRESSOR) 100 MG tablet   Other Relevant Orders   CBC with Differential/Platelet (Completed)    Comprehensive metabolic panel (Completed)   TSH (Completed)   Lipid panel (Completed)     Endocrine   Hypothyroidism    Subclinical/asymptomatic tsh today  Will continue to follow       Relevant Medications   metoprolol (LOPRESSOR) 100 MG tablet   Other Relevant Orders   TSH (Completed)     Other   Obesity    Discussed how this problem influences overall health and the risks it imposes  Reviewed plan for weight loss with lower calorie diet (via better food choices and also portion control or program like weight watchers) and exercise building up to or more than 30  minutes 5 days per week including some aerobic activity   Pt is not ready to get serious about a wt loss effort but realizes that this can cause some of her pain issues and is considering it

## 2015-04-24 NOTE — Assessment & Plan Note (Signed)
bp in fair control at this time  BP Readings from Last 1 Encounters:  04/23/15 118/74   No changes needed Disc lifstyle change with low sodium diet and exercise  Labs today

## 2015-04-24 NOTE — Assessment & Plan Note (Signed)
Subclinical/asymptomatic tsh today  Will continue to follow

## 2015-04-24 NOTE — Assessment & Plan Note (Signed)
Discussed how this problem influences overall health and the risks it imposes  Reviewed plan for weight loss with lower calorie diet (via better food choices and also portion control or program like weight watchers) and exercise building up to or more than 30 minutes 5 days per week including some aerobic activity   Pt is not ready to get serious about a wt loss effort but realizes that this can cause some of her pain issues and is considering it

## 2015-04-25 ENCOUNTER — Encounter: Payer: Self-pay | Admitting: *Deleted

## 2015-06-12 DIAGNOSIS — H40013 Open angle with borderline findings, low risk, bilateral: Secondary | ICD-10-CM | POA: Diagnosis not present

## 2015-10-15 DIAGNOSIS — N760 Acute vaginitis: Secondary | ICD-10-CM | POA: Diagnosis not present

## 2015-11-27 DIAGNOSIS — M7062 Trochanteric bursitis, left hip: Secondary | ICD-10-CM | POA: Diagnosis not present

## 2015-11-27 DIAGNOSIS — M25552 Pain in left hip: Secondary | ICD-10-CM | POA: Diagnosis not present

## 2015-12-10 DIAGNOSIS — J018 Other acute sinusitis: Secondary | ICD-10-CM | POA: Diagnosis not present

## 2016-01-01 DIAGNOSIS — R05 Cough: Secondary | ICD-10-CM | POA: Diagnosis not present

## 2016-01-01 DIAGNOSIS — J00 Acute nasopharyngitis [common cold]: Secondary | ICD-10-CM | POA: Diagnosis not present

## 2016-01-31 ENCOUNTER — Encounter (HOSPITAL_COMMUNITY): Payer: Self-pay

## 2016-01-31 ENCOUNTER — Telehealth: Payer: Self-pay | Admitting: Internal Medicine

## 2016-01-31 ENCOUNTER — Emergency Department (HOSPITAL_COMMUNITY): Payer: Medicare Other

## 2016-01-31 ENCOUNTER — Emergency Department (HOSPITAL_COMMUNITY)
Admission: EM | Admit: 2016-01-31 | Discharge: 2016-01-31 | Disposition: A | Discharge status: Home / Self Care | Payer: Medicare Other | Attending: Emergency Medicine | Admitting: Emergency Medicine

## 2016-01-31 DIAGNOSIS — K5730 Diverticulosis of large intestine without perforation or abscess without bleeding: Secondary | ICD-10-CM | POA: Diagnosis not present

## 2016-01-31 DIAGNOSIS — K7460 Unspecified cirrhosis of liver: Secondary | ICD-10-CM | POA: Diagnosis not present

## 2016-01-31 DIAGNOSIS — IMO0001 Reserved for inherently not codable concepts without codable children: Secondary | ICD-10-CM

## 2016-01-31 DIAGNOSIS — I10 Essential (primary) hypertension: Secondary | ICD-10-CM | POA: Insufficient documentation

## 2016-01-31 DIAGNOSIS — R030 Elevated blood-pressure reading, without diagnosis of hypertension: Secondary | ICD-10-CM

## 2016-01-31 DIAGNOSIS — R1032 Left lower quadrant pain: Secondary | ICD-10-CM | POA: Diagnosis not present

## 2016-01-31 LAB — URINALYSIS, ROUTINE W REFLEX MICROSCOPIC
BILIRUBIN URINE: NEGATIVE
Glucose, UA: NEGATIVE mg/dL
HGB URINE DIPSTICK: NEGATIVE
Ketones, ur: NEGATIVE mg/dL
Leukocytes, UA: NEGATIVE
Nitrite: NEGATIVE
Protein, ur: NEGATIVE mg/dL
SPECIFIC GRAVITY, URINE: 1.01 (ref 1.005–1.030)
pH: 6 (ref 5.0–8.0)

## 2016-01-31 LAB — COMPREHENSIVE METABOLIC PANEL
ALBUMIN: 4.1 g/dL (ref 3.5–5.0)
ALT: 37 U/L (ref 14–54)
AST: 45 U/L — ABNORMAL HIGH (ref 15–41)
Alkaline Phosphatase: 89 U/L (ref 38–126)
Anion gap: 6 (ref 5–15)
BUN: 9 mg/dL (ref 6–20)
CO2: 26 mmol/L (ref 22–32)
Calcium: 9.5 mg/dL (ref 8.9–10.3)
Chloride: 106 mmol/L (ref 101–111)
Creatinine, Ser: 0.83 mg/dL (ref 0.44–1.00)
GFR calc Af Amer: >60 mL/min (ref >60)
GFR calc non Af Amer: >60 mL/min (ref >60)
GLUCOSE: 99 mg/dL (ref 65–99)
Potassium: 4.3 mmol/L (ref 3.5–5.1)
SODIUM: 138 mmol/L (ref 135–145)
Total Bilirubin: 0.8 mg/dL (ref 0.3–1.2)
Total Protein: 6.8 g/dL (ref 6.5–8.1)

## 2016-01-31 LAB — CBC
HEMATOCRIT: 47 % — AB (ref 36.0–46.0)
HEMOGLOBIN: 15.3 g/dL — AB (ref 12.0–15.0)
MCH: 30.1 pg (ref 26.0–34.0)
MCHC: 32.6 g/dL (ref 30.0–36.0)
MCV: 92.5 fL (ref 78.0–100.0)
Platelets: 231 10*3/uL (ref 150–400)
RBC: 5.08 MIL/uL (ref 3.87–5.11)
RDW: 13.1 % (ref 11.5–15.5)
WBC: 6.8 10*3/uL (ref 4.0–10.5)

## 2016-01-31 LAB — LIPASE, BLOOD: Lipase: 25 U/L (ref 11–51)

## 2016-01-31 MED ORDER — IOPAMIDOL (ISOVUE-300) INJECTION 61%
INTRAVENOUS | Status: AC
  Administered 2016-01-31: 100 mL
  Filled 2016-01-31: qty 100

## 2016-01-31 NOTE — Telephone Encounter (Signed)
Former [facility] patient. Last seen 2016. Calls c/o severe "ice pick like" LLQ pain near hip bone. Has a history of diverticulitis, but she's not sure if that's what's going on. No vomiting or fever. She is concerned. Advised to go to ER for evaluation. She agrees

## 2016-01-31 NOTE — ED Provider Notes (Signed)
CSN: [id number]     Arrival date & time 01/31/16  [id number] History   First MD Initiated Contact with Patient 01/31/16 [phone]     Chief Complaint  Patient presents with  . LLQ pain     (Consider location/radiation/quality/duration/timing/severity/associated sxs/prior Treatment) The history is provided by the patient and medical records. No language interpreter was used.     Erin [name] is a 70 y.o. female  with a PMH of diverticulosis/diverticulitis, HTN, hypothyroidism who presents to the Emergency Department complaining of sharp LLQ abdominal pain that is described as "being stabbed with ice picks" starting this morning. Patient states over the last several years, she has had intermittent left lower quadrant pain, however today's pain is much more severe than her usual episodes. She is also experiencing some radiation into her groin area which has never occurred in the past. Denies n/v/d, constipation, fever/chills, dysuria, chest pain, sob, any additional symptoms. No medications taken PTA for symptoms.   PCP: Dr. Salvatore Decent   Past Medical History  Diagnosis Date  . Diverticulosis   . Diverticulitis   . Hypertension   . Hypothyroidism   . Osteopenia   . Arthritis   . Fibromyalgia   . Colon polyps   . Fatty liver   . Depression   . Spinal stenosis    Past Surgical History  Procedure Laterality Date  . Abdominal hysterectomy    . Tubal ligation    . Carpal tunnel repair    . Eyelid surgery    . Breast reduction surgery    . Liposuction trunk    . Colonoscopy    . Polypectomy     Family History  Problem Relation Age of Onset  . Heart disease Father   . Bladder Cancer Father   . Hypertension Mother   . Kidney cancer Mother   . Colon cancer Neg Hx   . Stomach cancer Brother   . Prostate cancer Brother    Social History  Substance Use Topics  . Smoking status: Never Smoker   . Smokeless tobacco: Never Used  . Alcohol Use: 0.0 oz/week    0 Standard drinks or  equivalent per week     Comment: occasional   OB History    No data available     Review of Systems  Constitutional: Negative for fever and chills.  HENT: Negative for congestion.   Eyes: Negative for visual disturbance.  Respiratory: Negative for cough and shortness of breath.   Cardiovascular: Negative.   Gastrointestinal: Positive for abdominal pain. Negative for nausea, vomiting, diarrhea and constipation.  Genitourinary: Negative for dysuria, urgency and frequency.  Musculoskeletal: Negative for back pain and neck pain.  Skin: Negative for rash.  Neurological: Negative for headaches.      Allergies  Flagyl and Codeine  Home Medications   Prior to Admission medications   Medication Sig Start Date End Date Taking? Authorizing Provider  dicyclomine (BENTYL) 20 MG tablet Take 20 mg by mouth daily as needed for spasms.    Yes Historical Provider, MD  metoprolol (LOPRESSOR) 100 MG tablet Take 1 tablet (100 mg total) by mouth 2 (two) times daily. 04/23/15  Yes Abner Greenspan, MD  naproxen sodium (ANAPROX) 220 MG tablet Take 220 mg by mouth 2 (two) times daily as needed (pain).   Yes Historical Provider, MD   BP 169/76 mmHg  Pulse 48  Temp(Src) 98.2 F (36.8 C) (Oral)  Resp 14  SpO2 97% Physical Exam  Constitutional: She is oriented  to person, place, and time. No distress.  Pleasant well-appearing female in NAD.   HENT:  Head: Normocephalic and atraumatic.  Cardiovascular:  RRR on exam, no m/r/g  Pulmonary/Chest: Effort normal and breath sounds normal. No respiratory distress. She has no wheezes. She has no rales. She exhibits no tenderness.  Abdominal: Soft. Bowel sounds are normal. She exhibits no distension and no mass. There is no rebound and no guarding.  Point tenderness with guarding to LLQ.   Musculoskeletal: Normal range of motion. She exhibits no edema.  Neurological: She is alert and oriented to person, place, and time.  Skin: Skin is warm and dry. She is not  diaphoretic.  Nursing note and vitals reviewed.   ED Course  Procedures (including critical care time) Labs Review Labs Reviewed  COMPREHENSIVE METABOLIC PANEL - Abnormal; Notable for the following:    AST 45 (*)    All other components within normal limits  CBC - Abnormal; Notable for the following:    Hemoglobin 15.3 (*)    HCT 47.0 (*)    All other components within normal limits  LIPASE, BLOOD  URINALYSIS, ROUTINE W REFLEX MICROSCOPIC (NOT AT [facility])    Imaging Review Ct Abdomen Pelvis W Contrast  01/31/2016  CLINICAL DATA:  Left lower quadrant pain for years EXAM: CT ABDOMEN AND PELVIS WITH CONTRAST TECHNIQUE: Multidetector CT imaging of the abdomen and pelvis was performed using the standard protocol following bolus administration of intravenous contrast. CONTRAST:  173mL ISOVUE-300 IOPAMIDOL (ISOVUE-300) INJECTION 61% COMPARISON:  03/02/2010 FINDINGS: Diffuse hepatic steatosis. The left lobe of the liver is prominent with a somewhat nodular contour. Gallbladder, spleen, pancreas, adrenal glands, and kidneys are within normal limits. No evidence of small bowel obstruction. No mass in the colon. Diverticulosis of the descending colon. Umbilical hernia contains adipose. Atherosclerotic changes of the aorta are noted. There are some atherosclerotic calcifications. There is slight left-sided bulge without ulceration head image 47. No evidence of aneurysm. Bladder is unremarkable. Uterus is absent. Adnexa are within normal limits. Right perirectal calcified lymph node. No abnormal retroperitoneal adenopathy by measurement criteria. No vertebral compression deformity. Facet arthropathy at L4-5 and L5-S1. IMPRESSION: No acute intra-abdominal pathology. Cirrhotic liver. Electronically Signed   By: Marybelle Killings M.D.   On: 01/31/2016 12:00   I have personally reviewed and evaluated these images and lab results as part of my medical decision-making.   EKG Interpretation None      MDM   Final  diagnoses:  LLQ pain  Elevated blood pressure   Erin [name] presents to ED for worsening LLQ abdominal pain that began this morning. Hx of similar and told she had diverticulitis, treated with ABX, but has never had CT scan of abdomen. On exam, patient is very well appearing. Elevated BP but otherwise vitals wdl. Nonsurgical abdomen: soft with point tenderness to LLQ. Did play golf on Thursday, so possibly musk etiology, however given point tenderness and age, will obtain CT scan and abdominal labs/urine.   UA wdl. CMP, CBC, lipase reassuring. CT abdomen with diverticulosis but no diverticulitis. No acute abdominal abnormalities. Patient is followed by Dr. Glori Bickers PCP and states she can follow up easily. Strongly encouraged PCP follow up. Return precautions discussed and all questions answered.   Patient discussed with Dr. Jeanell Sparrow who agrees with treatment plan.    [facility] Margit Batte, PA-C 01/31/16 1257  Pattricia Boss, MD 01/31/16 [phone]

## 2016-01-31 NOTE — ED Notes (Signed)
Called CT.  They stated pt is next in line, now that her labs have resulted.

## 2016-01-31 NOTE — ED Notes (Signed)
Patient complains of LLQ pain that she describes as being stabbed with ice pick since yesterday. No nausea, no vomiting, no diarrhea. Hx of diverticulitis.

## 2016-01-31 NOTE — Discharge Instructions (Signed)
Please follow up with your primary doctor for discussion of your diagnoses and further evaluation after today's visit.   Please seek immediate care if you develop any of the following symptoms: The pain worsens You have a fever.  You begin vomiting. You pass bloody or black tarry stools.  There is bright red blood in the stool.  You do not seem to be getting better.  You have any questions or concerns.

## 2016-02-23 ENCOUNTER — Other Ambulatory Visit: Payer: Self-pay | Admitting: Family Medicine

## 2016-02-23 NOTE — Telephone Encounter (Signed)
Please schedule a 30 min office visit for f/u in the fall and refill until then

## 2016-02-23 NOTE — Telephone Encounter (Signed)
Electronic refill requesting no recent/future appt., please advise

## 2016-02-24 NOTE — Telephone Encounter (Signed)
Called pt and no answer and no voicemail (kept ringing) 

## 2016-03-08 ENCOUNTER — Other Ambulatory Visit: Payer: Self-pay | Admitting: Family Medicine

## 2016-03-09 NOTE — Telephone Encounter (Signed)
Per Dr. Glori Bickers pt needs an 67min follow up, called pt and no answer and no voicemail  (kept ringing)

## 2016-03-09 NOTE — Telephone Encounter (Signed)
Patient called to ask about medication refill.  Patient scheduled follow up on 03/22/16.  Patient has 3-4 pills left.  Patient is asking for enough medication to be called in until she can be seen to [facility].

## 2016-03-10 NOTE — Telephone Encounter (Signed)
Med refilled.

## 2016-03-22 ENCOUNTER — Ambulatory Visit: Payer: Medicare Other | Admitting: Family Medicine

## 2016-04-05 ENCOUNTER — Encounter: Payer: Self-pay | Admitting: Family Medicine

## 2016-04-05 ENCOUNTER — Ambulatory Visit (INDEPENDENT_AMBULATORY_CARE_PROVIDER_SITE_OTHER): Payer: Medicare Other | Admitting: Family Medicine

## 2016-04-05 VITALS — BP 149/80 | HR 56 | Temp 98.3°F | Ht 62.5 in | Wt 205.5 lb

## 2016-04-05 DIAGNOSIS — E039 Hypothyroidism, unspecified: Secondary | ICD-10-CM | POA: Diagnosis not present

## 2016-04-05 DIAGNOSIS — I10 Essential (primary) hypertension: Secondary | ICD-10-CM | POA: Diagnosis not present

## 2016-04-05 DIAGNOSIS — E669 Obesity, unspecified: Secondary | ICD-10-CM

## 2016-04-05 DIAGNOSIS — E781 Pure hyperglyceridemia: Secondary | ICD-10-CM

## 2016-04-05 MED ORDER — DICYCLOMINE HCL 20 MG PO TABS: 20.0000 mg | ORAL_TABLET | Freq: Every day | ORAL | 1 refills | Status: DC | PRN

## 2016-04-05 MED ORDER — METOPROLOL TARTRATE 100 MG PO TABS: 100.0000 mg | ORAL_TABLET | Freq: Two times a day (BID) | ORAL | 3 refills | Status: DC

## 2016-04-05 NOTE — Assessment & Plan Note (Signed)
Lab today  Per pt - diet is mildly improved

## 2016-04-05 NOTE — Progress Notes (Signed)
Pre visit review using our clinic review tool, if applicable. No additional management support is needed unless otherwise documented below in the visit note. 

## 2016-04-05 NOTE — Assessment & Plan Note (Signed)
Discussed how this problem influences overall health and the risks it imposes  Reviewed plan for weight loss with lower calorie diet (via better food choices and also portion control or program like weight watchers) and exercise building up to or more than 30 minutes 5 days per week including some aerobic activity    

## 2016-04-05 NOTE — Assessment & Plan Note (Signed)
Due for labs No tx currently  No clinical changes

## 2016-04-05 NOTE — Assessment & Plan Note (Signed)
bp is up - on 2 checks Pt thinks this is due to waiting too long in the waiting room and getting angry  Seems calm on interview Enc wt loss  Given handout on DASH eating plan Refilled metoprolol F/u 1 mo for re check Will add tx if needed

## 2016-04-05 NOTE — Progress Notes (Signed)
Subjective:    Patient ID: Erin [name], female    DOB: 1946/07/26, 69 y.o.   MRN: [id number]  HPI Here for f/u of chronic medical problems  Has been feeling good  Taking good care of herself   Wt Readings from Last 3 Encounters:  04/05/16 205 lb 8 oz (93.2 kg)  04/23/15 207 lb (93.9 kg)  03/19/15 207 lb 2 oz (94 kg)    Trying to loose weight  Eating smarter and eating less - cut back on bread and also no sodas  Getting some exercise/walking and golfing  Lots of stairs in her condo  bmi is 36.9   bp is stable today  No cp or palpitations or headaches or edema  No side effects to medicines  BP Readings from Last 3 Encounters:  04/05/16 (!) 134/94  01/31/16 137/96  04/23/15 118/74     She was upset when she came in -since she had to wait so long    Due for labs   Has bursitis on L hip - using ice that helps   Needs refill on dicyclomine - prev from her GI -needs refill   Not interested in hep C screening  Not interested in dexa or mammogram  Declines flu shots   Patient Active Problem List   Diagnosis Date Noted  . DIVERTICULITIS-COLON 07/30/2009  . INTERNAL HEMORRHOIDS 06/10/2009  . FATTY LIVER DISEASE 06/10/2009  . LIVER FUNCTION TESTS, ABNORMAL, HX OF 06/10/2009  . Personal history of colonic polyps 06/10/2009  . GANGLION CYST 11/11/2008  . UNSPECIFIED DISORDER OF KIDNEY AND URETER 02/23/2008  . Obesity 09/28/2007  . TRANSAMINASES, SERUM, ELEVATED 05/31/2007  . Hypothyroidism 05/30/2007  . HYPERTRIGLYCERIDEMIA 05/30/2007  . Essential hypertension 05/30/2007  . DIVERTICULOSIS, COLON 05/30/2007  . 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 SPINAL STENOSIS, CERVICAL 05/30/2007  . FIBROMYALGIA 05/30/2007  . OSTEOPENIA 05/30/2007  . DEPRESSION, MAJOR, HX OF 05/30/2007   Past Medical History:  Diagnosis Date  . Arthritis   . Colon polyps   . Depression   . Diverticulitis   . Diverticulosis   . Fatty liver   . Fibromyalgia   . Hypertension   .  Hypothyroidism   . Osteopenia   . Spinal stenosis    Past Surgical History:  Procedure Laterality Date  . ABDOMINAL HYSTERECTOMY    . BREAST REDUCTION SURGERY    . carpal tunnel repair    . COLONOSCOPY    . eyelid surgery    . LIPOSUCTION TRUNK    . POLYPECTOMY    . TUBAL LIGATION     Social History  Substance Use Topics  . Smoking status: Never Smoker  . Smokeless tobacco: Never Used  . Alcohol use 0.0 oz/week     Comment: occasional   Family History  Problem Relation Age of Onset  . Heart disease Father   . Bladder Cancer Father   . Hypertension Mother   . Kidney cancer Mother   . Colon cancer Neg Hx   . Stomach cancer Brother   . Prostate cancer Brother    Allergies  Allergen Reactions  . Flagyl [Metronidazole] Diarrhea    Severe pain also.  . Codeine Itching   Current Outpatient Prescriptions on File Prior to Visit  Medication Sig Dispense Refill  . naproxen sodium (ANAPROX) 220 MG tablet Take 220 mg by mouth 2 (two) times daily as needed (pain).     No current facility-administered medications on file prior to visit.      Review  of Systems Review of Systems  Constitutional: Negative for fever, appetite change, fatigue and unexpected weight change.  Eyes: Negative for pain and visual disturbance.  Respiratory: Negative for cough and shortness of breath.   Cardiovascular: Negative for cp or palpitations    Gastrointestinal: Negative for nausea, diarrhea and constipation.  Genitourinary: Negative for urgency and frequency.  Skin: Negative for pallor or rash   Neurological: Negative for weakness, light-headedness, numbness and headaches.  Hematological: Negative for adenopathy. Does not bruise/bleed easily.  Psychiatric/Behavioral: Negative for dysphoric mood. The patient is not nervous/anxious.         Objective:   Physical Exam  Constitutional: She appears well-developed and well-nourished. No distress.  obese and well appearing   HENT:  Head:  Normocephalic and atraumatic.  Mouth/Throat: Oropharynx is clear and moist.  Eyes: Conjunctivae and EOM are normal. Pupils are equal, round, and reactive to light.  Neck: Normal range of motion. Neck supple. No JVD present. Carotid bruit is not present. No thyromegaly present.  Cardiovascular: Normal rate, regular rhythm, normal heart sounds and intact distal pulses.  Exam reveals no gallop.   Pulmonary/Chest: Effort normal and breath sounds normal. No respiratory distress. She has no wheezes. She has no rales.  No crackles  Abdominal: Soft. Bowel sounds are normal. She exhibits no distension, no abdominal bruit and no mass. There is no tenderness.  Musculoskeletal: She exhibits no edema.  Lymphadenopathy:    She has no cervical adenopathy.  Neurological: She is alert. She has normal reflexes.  Skin: Skin is warm and dry. No rash noted. No pallor.  Psychiatric: She has a normal mood and affect.  Initially upset over long wait time today          Assessment & Plan:   Problem List Items Addressed This Visit      Cardiovascular and Mediastinum   Essential hypertension - Primary    bp is up - on 2 checks Pt thinks this is due to waiting too long in the waiting room and getting angry  Seems calm on interview Enc wt loss  Given handout on DASH eating plan Refilled metoprolol F/u 1 mo for re check Will add tx if needed       Relevant Medications   metoprolol (LOPRESSOR) 100 MG tablet   Other Relevant Orders   CBC with Differential/Platelet   Comprehensive metabolic panel   TSH   Lipid panel     Endocrine   Hypothyroidism    Due for labs No tx currently  No clinical changes       Relevant Medications   metoprolol (LOPRESSOR) 100 MG tablet     Other   Obesity    Discussed how this problem influences overall health and the risks it imposes  Reviewed plan for weight loss with lower calorie diet (via better food choices and also portion control or program like weight  watchers) and exercise building up to or more than 30 minutes 5 days per week including some aerobic activity         HYPERTRIGLYCERIDEMIA    Lab today  Per pt - diet is mildly improved       Relevant Medications   metoprolol (LOPRESSOR) 100 MG tablet    Other Visit Diagnoses   None.

## 2016-04-05 NOTE — Patient Instructions (Addendum)
Drink lots of water  Try to cut back on sodium (processed foods) -especially hot dogs  Keep up with exercise  Labs today   Follow up in about a month for elevated blood pressure  Take a look at the [facility] handout

## 2016-04-06 LAB — CBC WITH DIFFERENTIAL/PLATELET
BASOS ABS: 0 10*3/uL (ref 0.0–0.1)
Basophils Relative: 0.5 % (ref 0.0–3.0)
EOS ABS: 0.3 10*3/uL (ref 0.0–0.7)
Eosinophils Relative: 3.4 % (ref 0.0–5.0)
HCT: 44.3 % (ref 36.0–46.0)
Hemoglobin: 15.2 g/dL — ABNORMAL HIGH (ref 12.0–15.0)
LYMPHS PCT: 39.8 % (ref 12.0–46.0)
Lymphs Abs: 3 10*3/uL (ref 0.7–4.0)
MCHC: 34.4 g/dL (ref 30.0–36.0)
MCV: 90.2 fl (ref 78.0–100.0)
MONO ABS: 0.7 10*3/uL (ref 0.1–1.0)
Monocytes Relative: 9.1 % (ref 3.0–12.0)
NEUTROS ABS: 3.6 10*3/uL (ref 1.4–7.7)
NEUTROS PCT: 47.2 % (ref 43.0–77.0)
PLATELETS: 228 10*3/uL (ref 150.0–400.0)
RBC: 4.91 Mil/uL (ref 3.87–5.11)
RDW: 13.4 % (ref 11.5–15.5)
WBC: 7.7 10*3/uL (ref 4.0–10.5)

## 2016-04-06 LAB — LIPID PANEL
Cholesterol: 190 mg/dL (ref 0–200)
HDL: 45.1 mg/dL (ref >39.00)
NONHDL: 145.27
Total CHOL/HDL Ratio: 4
Triglycerides: 344 mg/dL — ABNORMAL HIGH (ref 0.0–149.0)
VLDL: 68.8 mg/dL — ABNORMAL HIGH (ref 0.0–40.0)

## 2016-04-06 LAB — COMPREHENSIVE METABOLIC PANEL
ALT: 24 U/L (ref 0–35)
AST: 24 U/L (ref 0–37)
Albumin: 4.2 g/dL (ref 3.5–5.2)
Alkaline Phosphatase: 77 U/L (ref 39–117)
BILIRUBIN TOTAL: 0.4 mg/dL (ref 0.2–1.2)
BUN: 14 mg/dL (ref 6–23)
CALCIUM: 9.6 mg/dL (ref 8.4–10.5)
CHLORIDE: 106 meq/L (ref 96–112)
CO2: 33 meq/L — AB (ref 19–32)
CREATININE: 0.95 mg/dL (ref 0.40–1.20)
GFR: 61.73 mL/min (ref >60.00)
GLUCOSE: 92 mg/dL (ref 70–99)
Potassium: 4.3 mEq/L (ref 3.5–5.1)
SODIUM: 142 meq/L (ref 135–145)
Total Protein: 7.1 g/dL (ref 6.0–8.3)

## 2016-04-06 LAB — TSH: TSH: 3.71 u[IU]/mL (ref 0.35–4.50)

## 2016-04-06 LAB — LDL CHOLESTEROL, DIRECT: LDL DIRECT: 98 mg/dL

## 2016-05-08 ENCOUNTER — Encounter (HOSPITAL_COMMUNITY): Payer: Self-pay

## 2016-05-08 ENCOUNTER — Ambulatory Visit (HOSPITAL_COMMUNITY)
Admission: EM | Admit: 2016-05-08 | Discharge: 2016-05-08 | Disposition: A | Discharge status: Home / Self Care | Payer: Medicare Other | Attending: Internal Medicine | Admitting: Internal Medicine

## 2016-05-08 DIAGNOSIS — S20212A Contusion of left front wall of thorax, initial encounter: Secondary | ICD-10-CM

## 2016-05-08 DIAGNOSIS — R0781 Pleurodynia: Secondary | ICD-10-CM

## 2016-05-08 DIAGNOSIS — M25561 Pain in right knee: Secondary | ICD-10-CM | POA: Diagnosis not present

## 2016-05-08 DIAGNOSIS — G8929 Other chronic pain: Secondary | ICD-10-CM | POA: Diagnosis not present

## 2016-05-08 MED ORDER — DICLOFENAC SODIUM 75 MG PO TBEC: 75.0000 mg | DELAYED_RELEASE_TABLET | Freq: Two times a day (BID) | ORAL | 0 refills | Status: DC

## 2016-05-08 MED ORDER — CLONAZEPAM 0.5 MG PO TABS: 0.5000 mg | ORAL_TABLET | Freq: Two times a day (BID) | ORAL | 0 refills | Status: DC | PRN

## 2016-05-08 NOTE — ED Provider Notes (Signed)
CSN: [id number]     Arrival date & time 05/08/16  1646 History   None    Chief Complaint  Patient presents with  . Rib Injury  . Knee Pain   (Consider location/radiation/quality/duration/timing/severity/associated sxs/prior Treatment) 70 y.o. female presents with recurrent right knee pain. Condition is chronic in nature. Condition is made better by nothing. Condition is made worse by nothing. Patient denies any relief from mobic prescribed by her orthopedic MD. Patient is also complaining of left rib pain after falling and hitting a box while playing cards. Condition is acute in nature. condition is made worse by deep respiration. Patient states the she has full range of motion and played golf today.        Past Medical History:  Diagnosis Date  . Arthritis   . Colon polyps   . Depression   . Diverticulitis   . Diverticulosis   . Fatty liver   . Fibromyalgia   . Hypertension   . Hypothyroidism   . Osteopenia   . Spinal stenosis    Past Surgical History:  Procedure Laterality Date  . ABDOMINAL HYSTERECTOMY    . BREAST REDUCTION SURGERY    . carpal tunnel repair    . COLONOSCOPY    . eyelid surgery    . LIPOSUCTION TRUNK    . POLYPECTOMY    . TUBAL LIGATION     Family History  Problem Relation Age of Onset  . Heart disease Father   . Bladder Cancer Father   . Hypertension Mother   . Kidney cancer Mother   . Stomach cancer Brother   . Prostate cancer Brother   . Colon cancer Neg Hx    Social History  Substance Use Topics  . Smoking status: Never Smoker  . Smokeless tobacco: Never Used  . Alcohol use 0.0 oz/week     Comment: occasional   OB History    No data available     Review of Systems  Constitutional: Negative.   Musculoskeletal:       Pain to right knee and left rib change    Allergies  Flagyl [metronidazole] and Codeine  Home Medications   Prior to Admission medications   Medication Sig Start Date End Date Taking? Authorizing Provider   metoprolol (LOPRESSOR) 100 MG tablet Take 1 tablet (100 mg total) by mouth 2 (two) times daily. 04/05/16  Yes Abner Greenspan, MD  clonazePAM (KLONOPIN) 0.5 MG tablet Take 1 tablet (0.5 mg total) by mouth 2 (two) times daily as needed for anxiety. 05/08/16   Jacqualine Mau, NP  diclofenac (VOLTAREN) 75 MG EC tablet Take 1 tablet (75 mg total) by mouth 2 (two) times daily. 05/08/16   Jacqualine Mau, NP  dicyclomine (BENTYL) 20 MG tablet Take 1 tablet (20 mg total) by mouth daily as needed for spasms. 04/05/16   Abner Greenspan, MD  naproxen sodium (ANAPROX) 220 MG tablet Take 220 mg by mouth 2 (two) times daily as needed (pain).    Historical Provider, MD   Meds Ordered and Administered this Visit  Medications - No data to display  BP 161/79 (BP Location: Left Arm)   Pulse 68   Temp 99.1 F (37.3 C) (Oral)   Resp 16   SpO2 99%  No data found.   Physical Exam  Constitutional: She is oriented to person, place, and time. She appears well-developed and well-nourished.  HENT:  Head: Normocephalic and atraumatic.  Eyes: Conjunctivae are normal.  Neck: Normal range of  motion.  Pulmonary/Chest: Effort normal.  Musculoskeletal: Normal range of motion. She exhibits edema ( to medial aspect of rigt knee. Pain with palpation to left sternum under breast).  Neurological: She is alert and oriented to person, place, and time.  Skin: Skin is warm.  Psychiatric: She has a normal mood and affect.  Nursing note and vitals reviewed.   Urgent Care Course   Clinical Course    Procedures (including critical care time)  Labs Review Labs Reviewed - No data to display  Imaging Review No results found.   Visual Acuity Review  Right Eye Distance:   Left Eye Distance:   Bilateral Distance:    Right Eye Near:   Left Eye Near:    Bilateral Near:         MDM   1. Chronic pain of right knee   2. Contusion of rib on left side, initial encounter   3. Rib pain on left side         Jacqualine Mau, NP 05/08/16 1826

## 2016-05-08 NOTE — ED Triage Notes (Signed)
Patient presents with recurrent right knee pain and she states she was leaning on a chair arm and heard a pop on left side rib cage and now is having dull pain in that area

## 2016-05-28 ENCOUNTER — Telehealth: Payer: Self-pay | Admitting: Family Medicine

## 2016-05-28 NOTE — Telephone Encounter (Signed)
Pt called and declined AWV at this time.

## 2016-05-28 NOTE — Telephone Encounter (Signed)
Pt to call back and schedule AWV + labs with Katha Cabal and CPE with PCP.

## 2016-05-31 DIAGNOSIS — M25561 Pain in right knee: Secondary | ICD-10-CM | POA: Diagnosis not present

## 2016-06-03 ENCOUNTER — Other Ambulatory Visit: Payer: Self-pay | Admitting: Family Medicine

## 2016-06-05 ENCOUNTER — Other Ambulatory Visit: Payer: Self-pay | Admitting: Family Medicine

## 2016-06-07 DIAGNOSIS — M25561 Pain in right knee: Secondary | ICD-10-CM | POA: Diagnosis not present

## 2016-06-24 DIAGNOSIS — S83206A Unspecified tear of unspecified meniscus, current injury, right knee, initial encounter: Secondary | ICD-10-CM | POA: Diagnosis not present

## 2016-07-01 DIAGNOSIS — L821 Other seborrheic keratosis: Secondary | ICD-10-CM | POA: Diagnosis not present

## 2016-07-01 DIAGNOSIS — L570 Actinic keratosis: Secondary | ICD-10-CM | POA: Diagnosis not present

## 2016-07-28 DIAGNOSIS — M23241 Derangement of anterior horn of lateral meniscus due to old tear or injury, right knee: Secondary | ICD-10-CM | POA: Diagnosis not present

## 2016-07-28 DIAGNOSIS — M23221 Derangement of posterior horn of medial meniscus due to old tear or injury, right knee: Secondary | ICD-10-CM | POA: Diagnosis not present

## 2016-07-28 DIAGNOSIS — G8918 Other acute postprocedural pain: Secondary | ICD-10-CM | POA: Diagnosis not present

## 2016-07-28 DIAGNOSIS — M94261 Chondromalacia, right knee: Secondary | ICD-10-CM | POA: Diagnosis not present

## 2016-07-28 DIAGNOSIS — M6751 Plica syndrome, right knee: Secondary | ICD-10-CM | POA: Diagnosis not present

## 2016-08-04 DIAGNOSIS — M25661 Stiffness of right knee, not elsewhere classified: Secondary | ICD-10-CM | POA: Diagnosis not present

## 2016-08-04 DIAGNOSIS — M25461 Effusion, right knee: Secondary | ICD-10-CM | POA: Diagnosis not present

## 2016-08-04 DIAGNOSIS — R262 Difficulty in walking, not elsewhere classified: Secondary | ICD-10-CM | POA: Diagnosis not present

## 2016-08-04 DIAGNOSIS — M25561 Pain in right knee: Secondary | ICD-10-CM | POA: Diagnosis not present

## 2016-08-19 DIAGNOSIS — Z9889 Other specified postprocedural states: Secondary | ICD-10-CM | POA: Diagnosis not present

## 2016-09-08 IMAGING — CT CT ABD-PELV W/ CM
2 of 5 series · 4 of 46 positions shown, 5 images · IV contrast (Iodine)
Comparison: 03/02/2010

CLINICAL DATA: Left lower quadrant pain for years

EXAM:
CT ABDOMEN AND PELVIS WITH CONTRAST
TECHNIQUE: Multidetector CT imaging of the abdomen and pelvis was performed
using the standard protocol following bolus administration of
intravenous contrast.
CONTRAST:  100mL BBZLL5-ILL IOPAMIDOL (BBZLL5-ILL) INJECTION 61%

[Series 207: cor- · coronal · 0.45mm/px · 3 of 133 slices shown, 4 images]
[im 30/133  soft-tissue]
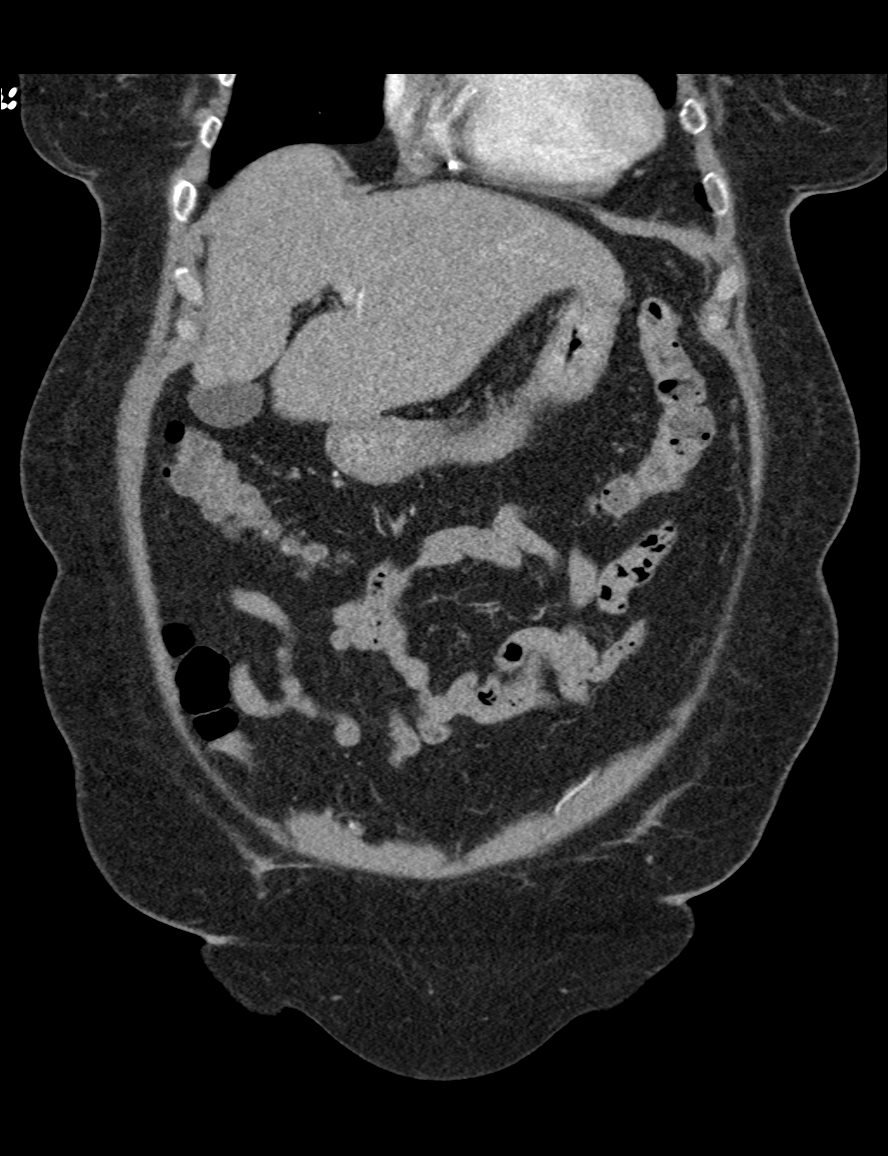
[im 30/133  bone]
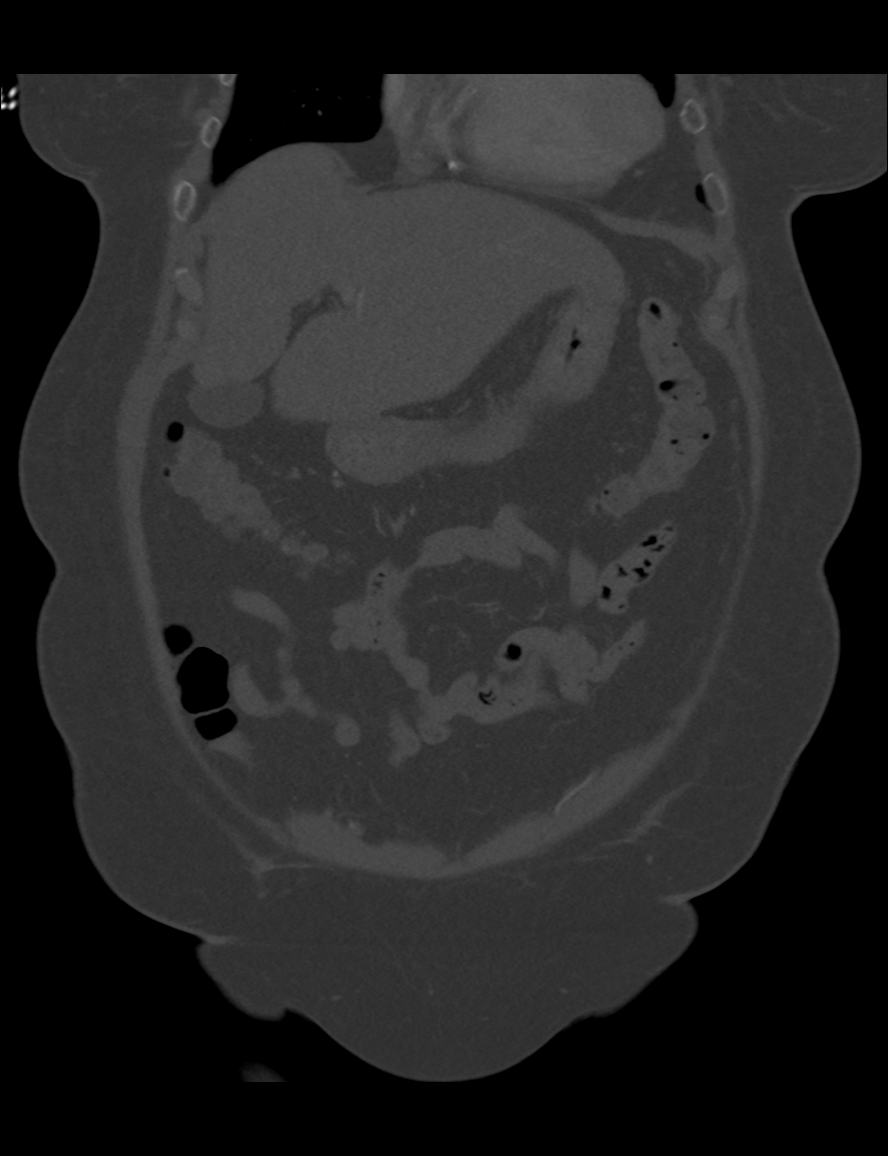
[im 74/133  soft-tissue]
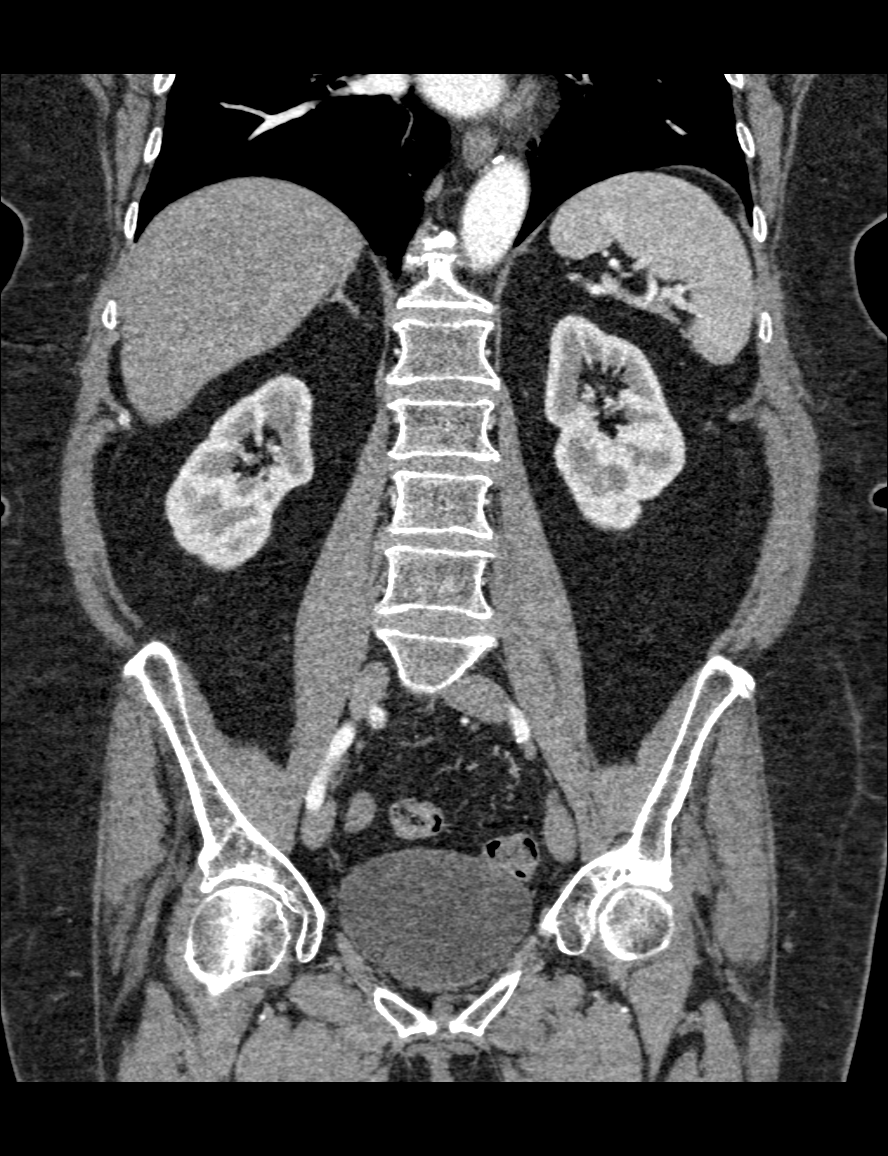
[im 103/133  soft-tissue]
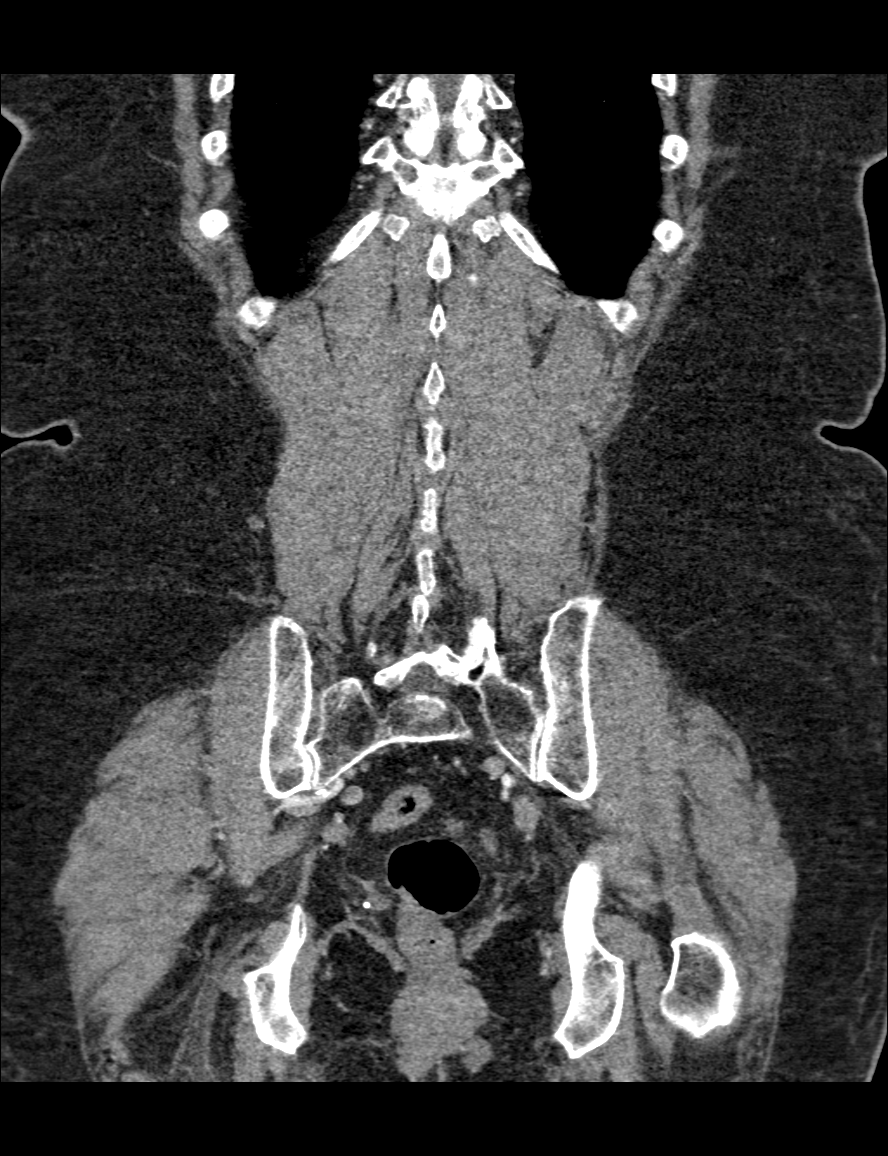

[Series 208: sag- · sagittal · 0.45mm/px · 1 of 172 slices shown]
[im 58/172  soft-tissue]
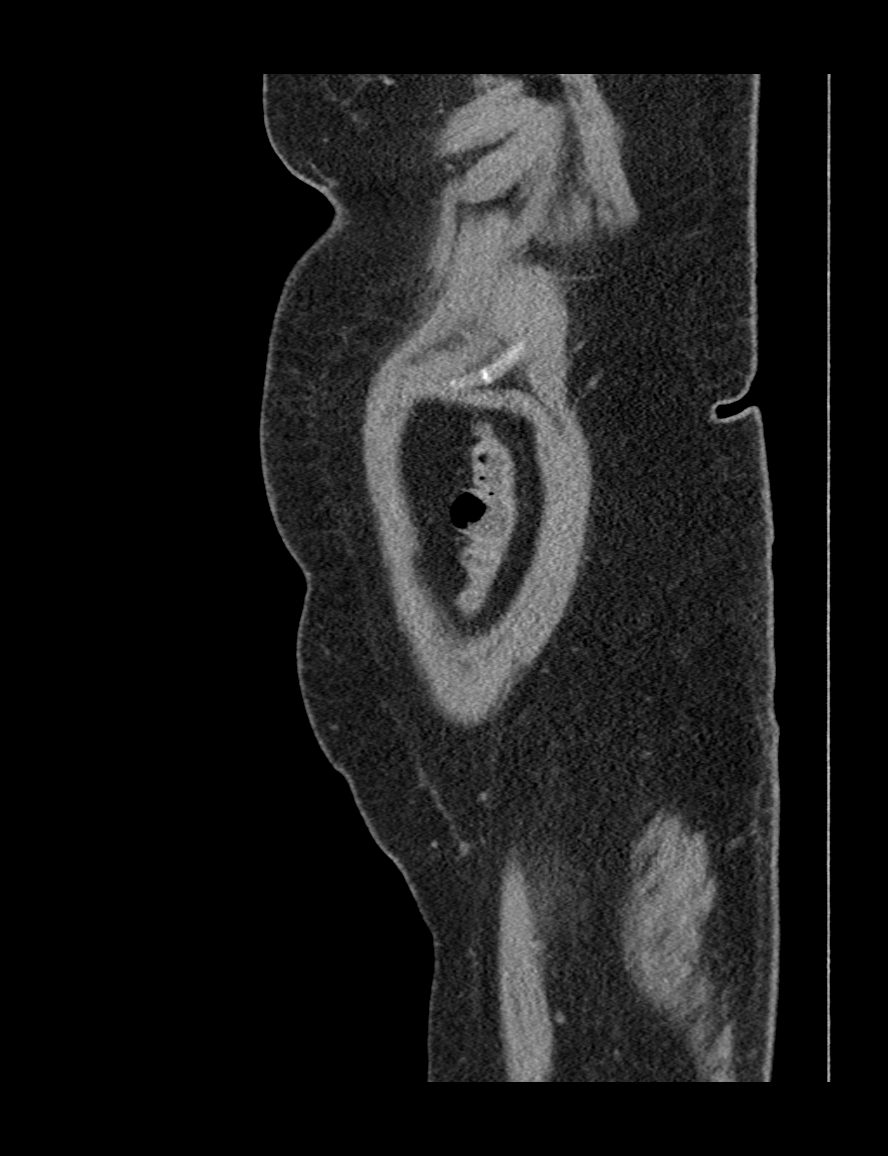

[4 of 46 positions shown; findings below may reference images not displayed]

FINDINGS: Diffuse hepatic steatosis. The left lobe of the liver is prominent
with a somewhat nodular contour.

Gallbladder, spleen, pancreas, adrenal glands, and kidneys are
within normal limits.

No evidence of small bowel obstruction. No mass in the colon.
Diverticulosis of the descending colon.

Umbilical hernia contains adipose.

Atherosclerotic changes of the aorta are noted. There are some
atherosclerotic calcifications. There is slight left-sided bulge
without ulceration head image 47. No evidence of aneurysm.

Bladder is unremarkable. Uterus is absent. Adnexa are within normal
limits.

Right perirectal calcified lymph node. No abnormal retroperitoneal
adenopathy by measurement criteria.

No vertebral compression deformity. Facet arthropathy at L4-5 and
L5-S1.
IMPRESSION: No acute intra-abdominal pathology.

Cirrhotic liver.

## 2016-09-17 DIAGNOSIS — H2513 Age-related nuclear cataract, bilateral: Secondary | ICD-10-CM | POA: Diagnosis not present

## 2016-09-17 DIAGNOSIS — H40013 Open angle with borderline findings, low risk, bilateral: Secondary | ICD-10-CM | POA: Diagnosis not present

## 2016-09-23 DIAGNOSIS — M25561 Pain in right knee: Secondary | ICD-10-CM | POA: Diagnosis not present

## 2016-09-30 ENCOUNTER — Telehealth: Payer: Self-pay | Admitting: Family Medicine

## 2016-09-30 NOTE — Telephone Encounter (Signed)
Pt is in [facility] and will call back to schedule AWV

## 2016-11-09 DIAGNOSIS — M1711 Unilateral primary osteoarthritis, right knee: Secondary | ICD-10-CM | POA: Diagnosis not present

## 2016-11-25 DIAGNOSIS — M25551 Pain in right hip: Secondary | ICD-10-CM | POA: Diagnosis not present

## 2016-12-16 DIAGNOSIS — M5441 Lumbago with sciatica, right side: Secondary | ICD-10-CM | POA: Diagnosis not present

## 2016-12-16 DIAGNOSIS — M1612 Unilateral primary osteoarthritis, left hip: Secondary | ICD-10-CM | POA: Diagnosis not present

## 2016-12-16 DIAGNOSIS — M545 Low back pain: Secondary | ICD-10-CM | POA: Diagnosis not present

## 2017-02-14 DIAGNOSIS — R300 Dysuria: Secondary | ICD-10-CM | POA: Diagnosis not present

## 2017-02-14 DIAGNOSIS — N3001 Acute cystitis with hematuria: Secondary | ICD-10-CM | POA: Diagnosis not present

## 2017-03-07 DIAGNOSIS — M5136 Other intervertebral disc degeneration, lumbar region: Secondary | ICD-10-CM | POA: Diagnosis not present

## 2017-03-07 DIAGNOSIS — M9904 Segmental and somatic dysfunction of sacral region: Secondary | ICD-10-CM | POA: Diagnosis not present

## 2017-03-07 DIAGNOSIS — M9903 Segmental and somatic dysfunction of lumbar region: Secondary | ICD-10-CM | POA: Diagnosis not present

## 2017-03-07 DIAGNOSIS — M461 Sacroiliitis, not elsewhere classified: Secondary | ICD-10-CM | POA: Diagnosis not present

## 2017-03-09 DIAGNOSIS — M9903 Segmental and somatic dysfunction of lumbar region: Secondary | ICD-10-CM | POA: Diagnosis not present

## 2017-03-09 DIAGNOSIS — M5136 Other intervertebral disc degeneration, lumbar region: Secondary | ICD-10-CM | POA: Diagnosis not present

## 2017-03-09 DIAGNOSIS — M461 Sacroiliitis, not elsewhere classified: Secondary | ICD-10-CM | POA: Diagnosis not present

## 2017-03-09 DIAGNOSIS — M9904 Segmental and somatic dysfunction of sacral region: Secondary | ICD-10-CM | POA: Diagnosis not present

## 2017-03-15 ENCOUNTER — Ambulatory Visit: Payer: Medicare Other | Admitting: Family Medicine

## 2017-05-25 DIAGNOSIS — H25813 Combined forms of age-related cataract, bilateral: Secondary | ICD-10-CM | POA: Diagnosis not present

## 2017-05-25 DIAGNOSIS — H353131 Nonexudative age-related macular degeneration, bilateral, early dry stage: Secondary | ICD-10-CM | POA: Diagnosis not present

## 2017-05-30 DIAGNOSIS — Z1329 Encounter for screening for other suspected endocrine disorder: Secondary | ICD-10-CM | POA: Diagnosis not present

## 2017-05-30 DIAGNOSIS — N952 Postmenopausal atrophic vaginitis: Secondary | ICD-10-CM | POA: Diagnosis not present

## 2017-05-30 DIAGNOSIS — Z1211 Encounter for screening for malignant neoplasm of colon: Secondary | ICD-10-CM | POA: Diagnosis not present

## 2017-05-30 DIAGNOSIS — Z1272 Encounter for screening for malignant neoplasm of vagina: Secondary | ICD-10-CM | POA: Diagnosis not present

## 2017-05-30 DIAGNOSIS — Z1231 Encounter for screening mammogram for malignant neoplasm of breast: Secondary | ICD-10-CM | POA: Diagnosis not present

## 2017-05-30 DIAGNOSIS — Z01411 Encounter for gynecological examination (general) (routine) with abnormal findings: Secondary | ICD-10-CM | POA: Diagnosis not present

## 2017-05-30 DIAGNOSIS — R5383 Other fatigue: Secondary | ICD-10-CM | POA: Diagnosis not present

## 2017-05-30 DIAGNOSIS — Z780 Asymptomatic menopausal state: Secondary | ICD-10-CM | POA: Diagnosis not present

## 2017-05-31 DIAGNOSIS — R5383 Other fatigue: Secondary | ICD-10-CM | POA: Diagnosis not present

## 2017-05-31 DIAGNOSIS — Z1329 Encounter for screening for other suspected endocrine disorder: Secondary | ICD-10-CM | POA: Diagnosis not present

## 2017-06-03 ENCOUNTER — Other Ambulatory Visit: Payer: Self-pay | Admitting: Family Medicine

## 2017-06-09 ENCOUNTER — Other Ambulatory Visit: Payer: Self-pay | Admitting: Family Medicine

## 2017-06-14 DIAGNOSIS — Z01818 Encounter for other preprocedural examination: Secondary | ICD-10-CM | POA: Diagnosis not present

## 2017-06-14 DIAGNOSIS — H25811 Combined forms of age-related cataract, right eye: Secondary | ICD-10-CM | POA: Diagnosis not present

## 2017-06-14 DIAGNOSIS — H353131 Nonexudative age-related macular degeneration, bilateral, early dry stage: Secondary | ICD-10-CM | POA: Diagnosis not present

## 2017-06-14 DIAGNOSIS — H3530 Unspecified macular degeneration: Secondary | ICD-10-CM | POA: Diagnosis not present

## 2017-06-14 DIAGNOSIS — H25812 Combined forms of age-related cataract, left eye: Secondary | ICD-10-CM | POA: Diagnosis not present

## 2017-06-30 ENCOUNTER — Other Ambulatory Visit: Payer: Self-pay

## 2017-06-30 DIAGNOSIS — H2512 Age-related nuclear cataract, left eye: Secondary | ICD-10-CM | POA: Diagnosis not present

## 2017-06-30 DIAGNOSIS — I10 Essential (primary) hypertension: Secondary | ICD-10-CM | POA: Diagnosis not present

## 2017-06-30 DIAGNOSIS — H25812 Combined forms of age-related cataract, left eye: Secondary | ICD-10-CM | POA: Diagnosis not present

## 2017-07-14 DIAGNOSIS — H2511 Age-related nuclear cataract, right eye: Secondary | ICD-10-CM | POA: Diagnosis not present

## 2017-07-14 DIAGNOSIS — H25811 Combined forms of age-related cataract, right eye: Secondary | ICD-10-CM | POA: Diagnosis not present

## 2017-07-14 DIAGNOSIS — I10 Essential (primary) hypertension: Secondary | ICD-10-CM | POA: Diagnosis not present

## 2017-07-27 DIAGNOSIS — L821 Other seborrheic keratosis: Secondary | ICD-10-CM | POA: Diagnosis not present

## 2017-07-27 DIAGNOSIS — D1801 Hemangioma of skin and subcutaneous tissue: Secondary | ICD-10-CM | POA: Diagnosis not present

## 2017-08-06 DIAGNOSIS — J069 Acute upper respiratory infection, unspecified: Secondary | ICD-10-CM | POA: Diagnosis not present

## 2017-08-06 DIAGNOSIS — H6520 Chronic serous otitis media, unspecified ear: Secondary | ICD-10-CM | POA: Diagnosis not present

## 2017-08-06 DIAGNOSIS — J209 Acute bronchitis, unspecified: Secondary | ICD-10-CM | POA: Diagnosis not present

## 2017-08-10 DIAGNOSIS — R7989 Other specified abnormal findings of blood chemistry: Secondary | ICD-10-CM | POA: Diagnosis not present

## 2017-08-10 DIAGNOSIS — E039 Hypothyroidism, unspecified: Secondary | ICD-10-CM | POA: Diagnosis not present

## 2017-08-15 DIAGNOSIS — M9903 Segmental and somatic dysfunction of lumbar region: Secondary | ICD-10-CM | POA: Diagnosis not present

## 2017-08-15 DIAGNOSIS — M9904 Segmental and somatic dysfunction of sacral region: Secondary | ICD-10-CM | POA: Diagnosis not present

## 2017-08-15 DIAGNOSIS — M461 Sacroiliitis, not elsewhere classified: Secondary | ICD-10-CM | POA: Diagnosis not present

## 2017-08-15 DIAGNOSIS — M5136 Other intervertebral disc degeneration, lumbar region: Secondary | ICD-10-CM | POA: Diagnosis not present

## 2017-08-19 DIAGNOSIS — M461 Sacroiliitis, not elsewhere classified: Secondary | ICD-10-CM | POA: Diagnosis not present

## 2017-08-19 DIAGNOSIS — M5136 Other intervertebral disc degeneration, lumbar region: Secondary | ICD-10-CM | POA: Diagnosis not present

## 2017-08-19 DIAGNOSIS — M9903 Segmental and somatic dysfunction of lumbar region: Secondary | ICD-10-CM | POA: Diagnosis not present

## 2017-08-19 DIAGNOSIS — M9904 Segmental and somatic dysfunction of sacral region: Secondary | ICD-10-CM | POA: Diagnosis not present

## 2017-08-22 DIAGNOSIS — M25562 Pain in left knee: Secondary | ICD-10-CM | POA: Diagnosis not present

## 2017-08-22 DIAGNOSIS — S8010XA Contusion of unspecified lower leg, initial encounter: Secondary | ICD-10-CM | POA: Diagnosis not present

## 2017-09-06 DIAGNOSIS — R05 Cough: Secondary | ICD-10-CM | POA: Diagnosis not present

## 2017-09-06 DIAGNOSIS — J019 Acute sinusitis, unspecified: Secondary | ICD-10-CM | POA: Diagnosis not present

## 2017-09-06 DIAGNOSIS — R509 Fever, unspecified: Secondary | ICD-10-CM | POA: Diagnosis not present

## 2017-12-13 ENCOUNTER — Encounter: Payer: Self-pay | Admitting: Gastroenterology

## 2017-12-15 ENCOUNTER — Ambulatory Visit: Payer: Medicare Other | Admitting: Family Medicine

## 2017-12-23 ENCOUNTER — Telehealth: Payer: Self-pay

## 2017-12-23 NOTE — Telephone Encounter (Signed)
[place] way to [facility] due to elevated BP; pt wants to  Know when started metoprolol 100 mg; advised pt per hx med list 2013. Pt was appreciative and had arrived at the [facility] and pt ended the call.

## 2018-01-04 ENCOUNTER — Ambulatory Visit (INDEPENDENT_AMBULATORY_CARE_PROVIDER_SITE_OTHER): Payer: Medicare Other | Admitting: Family Medicine

## 2018-01-04 ENCOUNTER — Encounter: Payer: Self-pay | Admitting: Family Medicine

## 2018-01-04 VITALS — BP 138/80 | HR 57 | Temp 98.3°F | Ht 62.5 in | Wt 209.5 lb

## 2018-01-04 DIAGNOSIS — Z6837 Body mass index (BMI) 37.0-37.9, adult: Secondary | ICD-10-CM

## 2018-01-04 DIAGNOSIS — E039 Hypothyroidism, unspecified: Secondary | ICD-10-CM

## 2018-01-04 DIAGNOSIS — I10 Essential (primary) hypertension: Secondary | ICD-10-CM | POA: Diagnosis not present

## 2018-01-04 DIAGNOSIS — R7303 Prediabetes: Secondary | ICD-10-CM

## 2018-01-04 NOTE — Progress Notes (Signed)
Subjective:    Patient ID: Erin [name], female    DOB: Jun 10, 1946, 72 y.o.   MRN: [id number]  HPI  Here for f/u of chronic medical problems   Wt Readings from Last 3 Encounters:  01/04/18 209 lb 8 oz (95 kg)  04/05/16 205 lb 8 oz (93.2 kg)  04/23/15 207 lb (93.9 kg)  exercise - golf / rides cart and walks some  Diet - is fair  Overall  No junk food / sweets rarely  37.71 kg/m    bp is fair today No cp or palpitations or headaches or edema  No side effects to medicines  BP Readings from Last 3 Encounters:  01/04/18 (!) 144/88  05/08/16 161/79  04/05/16 (!) 149/80      Pulse Readings from Last 3 Encounters:  01/04/18 (!) 57  05/08/16 68  04/05/16 (!) 56    Saw a np - was put on chlorthalidone- made her feel sick    Was seen in UC in [place] for her bp (moved to [employer])  She had stopped her metoprolol  No PCP there yet 136/98 there Pulse 127  cmet nl with ALT of 34   A1C was 5.8 and glucose of 104  EKG with RBBB CXR clear   Hypothyroidism  Pt has no clinical changes No change in energy level/ hair or skin/ edema and no tremor Lab Results  Component Value Date   TSH 3.71 04/05/2016     Is on levothyroxine at gyn where she lives  Is on 25 mcg Unsure when labs were- will keep up with gyn   Patient Active Problem List   Diagnosis Date Noted  . Prediabetes 01/04/2018  . DIVERTICULITIS-COLON 07/30/2009  . INTERNAL HEMORRHOIDS 06/10/2009  . FATTY LIVER DISEASE 06/10/2009  . LIVER FUNCTION TESTS, ABNORMAL, HX OF 06/10/2009  . Personal history of colonic polyps 06/10/2009  . GANGLION CYST 11/11/2008  . UNSPECIFIED DISORDER OF KIDNEY AND URETER 02/23/2008  . Obesity 09/28/2007  . TRANSAMINASES, SERUM, ELEVATED 05/31/2007  . Hypothyroidism 05/30/2007  . HYPERTRIGLYCERIDEMIA 05/30/2007  . Essential hypertension 05/30/2007  . DIVERTICULOSIS, COLON 05/30/2007  . 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 SPINAL STENOSIS, CERVICAL 05/30/2007  .  FIBROMYALGIA 05/30/2007  . OSTEOPENIA 05/30/2007  . DEPRESSION, MAJOR, HX OF 05/30/2007   Past Medical History:  Diagnosis Date  . Arthritis   . Colon polyps   . Depression   . Diverticulitis   . Diverticulosis   . Fatty liver   . Fibromyalgia   . Hypertension   . Hypothyroidism   . Osteopenia   . Spinal stenosis    Past Surgical History:  Procedure Laterality Date  . ABDOMINAL HYSTERECTOMY    . BREAST REDUCTION SURGERY    . carpal tunnel repair    . COLONOSCOPY    . eyelid surgery    . LIPOSUCTION TRUNK    . POLYPECTOMY    . TUBAL LIGATION     Social History   Tobacco Use  . Smoking status: Never Smoker  . Smokeless tobacco: Never Used  Substance Use Topics  . Alcohol use: Yes    Alcohol/week: 0.0 oz    Comment: occasional  . Drug use: No   Family History  Problem Relation Age of Onset  . Heart disease Father   . Bladder Cancer Father   . Hypertension Mother   . Kidney cancer Mother   . Stomach cancer Brother   . Prostate cancer Brother   . Colon cancer Neg  Hx    Allergies  Allergen Reactions  . Flagyl [Metronidazole] Diarrhea    Severe pain also.  . Chlorthalidone     malaise  . Codeine Itching   Current Outpatient Medications on File Prior to Visit  Medication Sig Dispense Refill  . dicyclomine (BENTYL) 20 MG tablet Take 1 tablet (20 mg total) by mouth daily as needed for spasms. 30 tablet 1  . levothyroxine (SYNTHROID, LEVOTHROID) 25 MCG tablet Take 25 mcg by mouth daily before breakfast.    . metoprolol tartrate (LOPRESSOR) 100 MG tablet TAKE 1 TABLET BY MOUTH TWICE A DAY 180 tablet 0   No current facility-administered medications on file prior to visit.     Review of Systems  Constitutional: Negative for activity change, appetite change, fatigue, fever and unexpected weight change.  HENT: Negative for congestion, ear pain, rhinorrhea, sinus pressure and sore throat.   Eyes: Negative for pain, redness and visual disturbance.  Respiratory:  Negative for cough, shortness of breath and wheezing.   Cardiovascular: Negative for chest pain and palpitations.  Gastrointestinal: Negative for abdominal pain, blood in stool, constipation and diarrhea.  Endocrine: Negative for polydipsia and polyuria.  Genitourinary: Negative for dysuria, frequency and urgency.  Musculoskeletal: Negative for arthralgias, back pain and myalgias.  Skin: Negative for pallor and rash.  Allergic/Immunologic: Negative for environmental allergies.  Neurological: Negative for dizziness, syncope and headaches.  Hematological: Negative for adenopathy. Does not bruise/bleed easily.  Psychiatric/Behavioral: Negative for decreased concentration and dysphoric mood. The patient is not nervous/anxious.        Objective:   Physical Exam  Constitutional: She appears well-developed and well-nourished. No distress.  obese and well appearing   HENT:  Head: Normocephalic and atraumatic.  Mouth/Throat: Oropharynx is clear and moist.  Eyes: Pupils are equal, round, and reactive to light. Conjunctivae and EOM are normal.  Neck: Normal range of motion. Neck supple. No JVD present. Carotid bruit is not present. No thyromegaly present.  Cardiovascular: Normal rate, regular rhythm, normal heart sounds and intact distal pulses. Exam reveals no gallop.  Pulmonary/Chest: Effort normal and breath sounds normal. No respiratory distress. She has no wheezes. She has no rales.  No crackles  Abdominal: Soft. Bowel sounds are normal. She exhibits no distension, no abdominal bruit and no mass. There is no tenderness.  Musculoskeletal: She exhibits no edema.  Lymphadenopathy:    She has no cervical adenopathy.  Neurological: She is alert. She has normal reflexes.  Skin: Skin is warm and dry. Capillary refill takes less than 2 seconds. No rash noted.  Tanned Solar lentigines diffusely   Psychiatric: She has a normal mood and affect.  Pleasant           Assessment & Plan:    Problem List Items Addressed This Visit      Cardiovascular and Mediastinum   Essential hypertension - Primary    bp in fair control at this time  BP Readings from Last 1 Encounters:  01/04/18 138/80   No changes needed Most recent labs reviewed  Disc lifstyle change with low sodium diet and exercise  Back on metoprolol bid with pulse in high 50s-tolerates well  Did not do well with chlorthalidone         Endocrine   Hypothyroidism    Her gyn NP (where she lives) has her on 25 mcg levothy and pending another draw No clinical changes        Relevant Medications   levothyroxine (SYNTHROID, LEVOTHROID) 25 MCG tablet  Other   Obesity    Discussed how this problem influences overall health and the risks it imposes  Reviewed plan for weight loss with lower calorie diet (via better food choices and also portion control or program like weight watchers) and exercise building up to or more than 30 minutes 5 days per week including some aerobic activity   Disc low glycemic diet and ways to get exercise       Prediabetes    A1C of 5.8  Obese  disc imp of low glycemic diet and wt loss to prevent DM2

## 2018-01-04 NOTE — Patient Instructions (Addendum)
You are pre diabetic Try to get most of your carbohydrates from produce (with the exception of white potatoes)  Eat less bread/pasta/rice/snack foods/cereals/sweets and other items from the middle of the grocery store (processed carbs)  Take a look at the handouts   You are due for a cholesterol test - make sure to check that at gyn    bp is improved  Please work up to 30 minutes (or more) of exercise daily

## 2018-01-08 NOTE — Assessment & Plan Note (Signed)
A1C of 5.8  Obese  disc imp of low glycemic diet and wt loss to prevent DM2

## 2018-01-08 NOTE — Assessment & Plan Note (Signed)
bp in fair control at this time  BP Readings from Last 1 Encounters:  01/04/18 138/80   No changes needed Most recent labs reviewed  Disc lifstyle change with low sodium diet and exercise  Back on metoprolol bid with pulse in high 50s-tolerates well  Did not do well with chlorthalidone

## 2018-01-08 NOTE — Assessment & Plan Note (Signed)
Discussed how this problem influences overall health and the risks it imposes  Reviewed plan for weight loss with lower calorie diet (via better food choices and also portion control or program like weight watchers) and exercise building up to or more than 30 minutes 5 days per week including some aerobic activity   Disc low glycemic diet and ways to get exercise

## 2018-01-08 NOTE — Assessment & Plan Note (Signed)
Her gyn NP (where she lives) has her on 25 mcg levothy and pending another draw No clinical changes

## 2018-01-25 ENCOUNTER — Encounter: Payer: Self-pay | Admitting: Gastroenterology

## 2018-04-05 ENCOUNTER — Encounter: Payer: Medicare Other | Admitting: Gastroenterology

## 2018-06-15 ENCOUNTER — Other Ambulatory Visit: Payer: Self-pay | Admitting: *Deleted

## 2018-06-15 MED ORDER — METOPROLOL TARTRATE 100 MG PO TABS: 100.0000 mg | ORAL_TABLET | Freq: Two times a day (BID) | ORAL | 0 refills | Status: DC

## 2018-09-09 ENCOUNTER — Other Ambulatory Visit: Payer: Self-pay | Admitting: Family Medicine

## 2018-11-30 ENCOUNTER — Other Ambulatory Visit: Payer: Self-pay | Admitting: Family Medicine

## 2018-11-30 NOTE — Telephone Encounter (Signed)
Pt hasn't been seen in almost a year and no future appts., ? If you would like to try and do virtual visit, please advise

## 2018-11-30 NOTE — Telephone Encounter (Signed)
Please schedule virtual visit and refill until then

## 2018-12-01 ENCOUNTER — Ambulatory Visit (INDEPENDENT_AMBULATORY_CARE_PROVIDER_SITE_OTHER): Payer: Medicare Other | Admitting: Family Medicine

## 2018-12-01 ENCOUNTER — Encounter: Payer: Self-pay | Admitting: Family Medicine

## 2018-12-01 VITALS — BP 134/70 | Wt 204.0 lb

## 2018-12-01 DIAGNOSIS — I10 Essential (primary) hypertension: Secondary | ICD-10-CM | POA: Diagnosis not present

## 2018-12-01 DIAGNOSIS — E039 Hypothyroidism, unspecified: Secondary | ICD-10-CM | POA: Diagnosis not present

## 2018-12-01 DIAGNOSIS — S42296S Other nondisplaced fracture of upper end of unspecified humerus, sequela: Secondary | ICD-10-CM

## 2018-12-01 DIAGNOSIS — R7303 Prediabetes: Secondary | ICD-10-CM

## 2018-12-01 DIAGNOSIS — K589 Irritable bowel syndrome without diarrhea: Secondary | ICD-10-CM | POA: Insufficient documentation

## 2018-12-01 DIAGNOSIS — M8580 Other specified disorders of bone density and structure, unspecified site: Secondary | ICD-10-CM

## 2018-12-01 DIAGNOSIS — E781 Pure hyperglyceridemia: Secondary | ICD-10-CM

## 2018-12-01 DIAGNOSIS — S42309A Unspecified fracture of shaft of humerus, unspecified arm, initial encounter for closed fracture: Secondary | ICD-10-CM | POA: Insufficient documentation

## 2018-12-01 DIAGNOSIS — Z6837 Body mass index (BMI) 37.0-37.9, adult: Secondary | ICD-10-CM

## 2018-12-01 DIAGNOSIS — K580 Irritable bowel syndrome with diarrhea: Secondary | ICD-10-CM

## 2018-12-01 MED ORDER — DICYCLOMINE HCL 20 MG PO TABS: 20.0000 mg | ORAL_TABLET | Freq: Every day | ORAL | 2 refills | Status: DC | PRN

## 2018-12-01 MED ORDER — METOPROLOL TARTRATE 100 MG PO TABS: 100.0000 mg | ORAL_TABLET | Freq: Two times a day (BID) | ORAL | 3 refills | Status: DC

## 2018-12-01 NOTE — Assessment & Plan Note (Signed)
Per pt fell on slick surface 4/85  Just released from ortho In [place]  Getting better  I inst her to disc getting dexa with her gyn  Also recommend ca and D

## 2018-12-01 NOTE — Assessment & Plan Note (Signed)
occ cramping/diarrhea  Refilled Bentyle -she uses infrequently  Due for 5 y recall colonoscopy-she is aware and unsure if she wants to do it

## 2018-12-01 NOTE — Telephone Encounter (Signed)
appt scheduled today, will hold off on refilling it until she see Dr. Glori Bickers

## 2018-12-01 NOTE — Assessment & Plan Note (Signed)
Due for labs Disc goals for lipids and reasons to control them Rev last labs with pt Rev low sat fat diet in detail Pt is not interested in watching diet at all  Will [facility] (sshe lives there) Strongly enc her to set up with Dr in [place]

## 2018-12-01 NOTE — Assessment & Plan Note (Signed)
dexa was years ago and so far she declines another one  Has however fx her humerus (feb 2020)  I strongly enc her to disc getting dexa with her gyn (she lives in [place])  Also strongly adv ca and D Try to get 1200-1500 mg of calcium per day with at least 1000 iu of vitamin D - for bone health  She has been resistant in the past but open to it now

## 2018-12-01 NOTE — Assessment & Plan Note (Signed)
Discussed how this problem influences overall health and the risks it imposes  Reviewed plan for weight loss with lower calorie diet (via better food choices and also portion control or program like weight watchers) and exercise building up to or more than 30 minutes 5 days per week including some aerobic activity   Pt is not interested in diet change at all

## 2018-12-01 NOTE — Assessment & Plan Note (Signed)
This is watched by her gyn physician and levothyroxine px Per pt no changes since last visit there and she plans to f/u (she lives in [place])

## 2018-12-01 NOTE — Assessment & Plan Note (Signed)
Labs needed Will send order to [facility] in [facility]  disc imp of low glycemic diet and wt loss to prevent DM2

## 2018-12-01 NOTE — Assessment & Plan Note (Signed)
Per pt last bp at orthopedic office this week was 137/84 No pulse changes Tolerates metoprolol 100 mg bid well  Will get back to exercise soon now that fx arm is healed She is not interested in diet change I strongly enc her to est with Dr in [place] she found (Dr Stann Mainland)

## 2018-12-01 NOTE — Progress Notes (Signed)
Virtual Visit via Telephone Note  I connected with Erin [name] on 12/01/18 at 10:15 AM EDT by telephone and verified that I am speaking with the correct person using two identifiers.  Location: Patient: home Provider: office  Pt does not have video capability   I discussed the limitations, risks, security and privacy concerns of performing an evaluation and management service by telephone and the availability of in person appointments. I also discussed with the patient that there may be a patient responsible charge related to this service. The patient expressed understanding and agreed to proceed.   History of Present Illness: Pt presents for annual f/u of chronic medical problems   Obesity Weight at home about 204 (was last here 209)  Does not eat healthy-not a priority for her    Back in feb she fell and broke arm in 4 places /near shoulder (fell carrying dog in vet office)  A hard fall Going to PT  Just got released from ortho  Getting better gradually   Is careful not to fall again  Needs to get her stamina back - she plans to start some walking with her husband   Gyn is Dr Erin [name]   bp has been stable since re starting metoprolol ([facility] visit last June)  No cp or palpitations or headaches or edema  No side effects to medicines  BP Readings from Last 3 Encounters:  01/04/18 138/80  05/08/16 161/79  04/05/16 (!) 149/80    Takes metoprolol 100 mg bid  Pt had the flu in dec and [facility] 134[facility]70 bp at ortho on Wednesday 137/84 she thinks   No symptoms at all  No problems with pulse rate    Hypothyroidism  Pt has no clinical changes, feels fine  No change in energy level/ hair or skin/ edema and no tremor Lab Results  Component Value Date   TSH 3.71 04/05/2016     Due for labs - with gyn  Has seen gyn for this in the past and they px   Pt declines health mt including colonoscopy/ pna imms/ mammogram /dexa /hep c screen and flu shot  She is  due for 5 y colonoscopy  Tetanus shot was 11/13  IBS- takes bentyl every 6 mo   Hyperlipidemia/triglycerides Lab Results  Component Value Date   CHOL 190 04/05/2016   HDL 45.10 04/05/2016   LDLCALC 101 (H) 08/22/2009   LDLDIRECT 98.0 04/05/2016   TRIG 344.0 (H) 04/05/2016   CHOLHDL 4 04/05/2016   Lab Results  Component Value Date   ALT 24 04/05/2016   AST 24 04/05/2016   ALKPHOS 77 04/05/2016   BILITOT 0.4 04/05/2016    Prediabetes Last A1C of 5.8  Review of Systems  Constitutional: Negative for chills, fever, malaise/fatigue and weight loss.  HENT: Negative for hearing loss and sore throat.   Eyes: Negative for blurred vision, discharge and redness.  Respiratory: Negative for cough and shortness of breath.   Cardiovascular: Negative for chest pain, palpitations and leg swelling.  Gastrointestinal: Negative for abdominal pain, blood in stool and heartburn.  Musculoskeletal: Positive for joint pain. Negative for myalgias.       Healing arm fracture  Skin: Negative for itching and rash.  Neurological: Negative for dizziness and headaches.  Endo/Heme/Allergies: Does not bruise/bleed easily.  Psychiatric/Behavioral: Negative for depression. The patient is not nervous/anxious.     Patient Active Problem List   Diagnosis Date Noted  . IBS (irritable bowel syndrome) 12/01/2018  .  Humerus fracture 12/01/2018  . Prediabetes 01/04/2018  . DIVERTICULITIS-COLON 07/30/2009  . INTERNAL HEMORRHOIDS 06/10/2009  . FATTY LIVER DISEASE 06/10/2009  . LIVER FUNCTION TESTS, ABNORMAL, HX OF 06/10/2009  . Personal history of colonic polyps 06/10/2009  . GANGLION CYST 11/11/2008  . UNSPECIFIED DISORDER OF KIDNEY AND URETER 02/23/2008  . Obesity 09/28/2007  . TRANSAMINASES, SERUM, ELEVATED 05/31/2007  . Hypothyroidism 05/30/2007  . HYPERTRIGLYCERIDEMIA 05/30/2007  . Essential hypertension 05/30/2007  . DIVERTICULOSIS, COLON 05/30/2007  . 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 SPINAL  STENOSIS, CERVICAL 05/30/2007  . FIBROMYALGIA 05/30/2007  . Osteopenia 05/30/2007  . DEPRESSION, MAJOR, HX OF 05/30/2007   Past Medical History:  Diagnosis Date  . Arthritis   . Colon polyps   . Depression   . Diverticulitis   . Diverticulosis   . Fatty liver   . Fibromyalgia   . Hypertension   . Hypothyroidism   . Osteopenia   . Spinal stenosis    Past Surgical History:  Procedure Laterality Date  . ABDOMINAL HYSTERECTOMY    . BREAST REDUCTION SURGERY    . carpal tunnel repair    . COLONOSCOPY    . eyelid surgery    . LIPOSUCTION TRUNK    . POLYPECTOMY    . TUBAL LIGATION     Social History   Tobacco Use  . Smoking status: Never Smoker  . Smokeless tobacco: Never Used  Substance Use Topics  . Alcohol use: Yes    Alcohol/week: 0.0 standard drinks    Comment: occasional  . Drug use: No   Family History  Problem Relation Age of Onset  . Heart disease Father   . Bladder Cancer Father   . Hypertension Mother   . Kidney cancer Mother   . Stomach cancer Brother   . Prostate cancer Brother   . Colon cancer Neg Hx    Allergies  Allergen Reactions  . Flagyl [Metronidazole] Diarrhea    Severe pain also.  . Chlorthalidone     malaise  . Codeine Itching   Current Outpatient Medications on File Prior to Visit  Medication Sig Dispense Refill  . levothyroxine (SYNTHROID, LEVOTHROID) 25 MCG tablet Take 25 mcg by mouth daily before breakfast.     No current facility-administered medications on file prior to visit.     Observations/Objective: Patient sounds well, like her normal self  Not in any distresss Cheerful and mentally sharp/nl affect  Able to hear well  Not hoarse or slurring  No sob or cough during the interview   Assessment and Plan: Problem List Items Addressed This Visit      Cardiovascular and Mediastinum   Essential hypertension - Primary    Per pt last bp at orthopedic office this week was 137/84 No pulse changes Tolerates metoprolol 100  mg bid well  Will get back to exercise soon now that fx arm is healed She is not interested in diet change I strongly enc her to est with Dr in [place] she found (Dr Erin [name])       Relevant Medications   metoprolol tartrate (LOPRESSOR) 100 MG tablet   Other Relevant Orders   CBC with Differential/Platelet   Comprehensive metabolic panel   Lipid panel   TSH     Digestive   IBS (irritable bowel syndrome)    occ cramping/diarrhea  Refilled Bentyle -she uses infrequently  Due for 5 y recall colonoscopy-she is aware and unsure if she wants to do it       Relevant Medications  dicyclomine (BENTYL) 20 MG tablet     Endocrine   Hypothyroidism    This is watched by her gyn physician and levothyroxine px Per pt no changes since last visit there and she plans to f/u (she lives in [place])      Relevant Medications   metoprolol tartrate (LOPRESSOR) 100 MG tablet   Other Relevant Orders   TSH     Musculoskeletal and Integument   Osteopenia    dexa was years ago and so far she declines another one  Has however fx her humerus (feb 2020)  I strongly enc her to disc getting dexa with her gyn (she lives in [place])  Also strongly adv ca and D Try to get 1200-1500 mg of calcium per day with at least 1000 iu of vitamin D - for bone health  She has been resistant in the past but open to it now      Humerus fracture    Per pt fell on slick surface 2/99  Just released from ortho [place]  Getting better  I inst her to disc getting dexa with her gyn  Also recommend ca and D        Other   HYPERTRIGLYCERIDEMIA    Due for labs Disc goals for lipids and reasons to control them Rev last labs with pt Rev low sat fat diet in detail Pt is not interested in watching diet at all  Will send lab order to [facility] in [facility] (sshe lives there) Strongly enc her to set up with Dr in [employer]      Relevant Medications   metoprolol tartrate (LOPRESSOR) 100 MG tablet    Other Relevant Orders   Lipid panel   Obesity    Discussed how this problem influences overall health and the risks it imposes  Reviewed plan for weight loss with lower calorie diet (via better food choices and also portion control or program like weight watchers) and exercise building up to or more than 30 minutes 5 days per week including some aerobic activity   Pt is not interested in diet change at all      Prediabetes    Labs needed Will send order to [place] [facility] in [facility]  disc imp of low glycemic diet and wt loss to prevent DM2       Relevant Orders   Hemoglobin A1c       Follow Up Instructions: I will send you a lab order for [facility]  Don't forget to follow up with your gyn doctor when able for annual visit and thyroid check  Also continue to work on getting a new primary care doctor where you live Try to eat a healthy diet low in fat and processed carbs  Exercise as tolerated Try to get 1200-1500 mg of calcium per day with at least 1000 iu of vitamin D - for bone health -this is important for bone health in light of your recent fracture    I discussed the assessment and treatment plan with the patient. The patient was provided an opportunity to ask questions and all were answered. The patient agreed with the plan and demonstrated an understanding of the instructions.   The patient was advised to call back or seek an in-person evaluation if the symptoms worsen or if the condition fails to improve as anticipated.  I provided 19 minutes of non-face-to-face time during this encounter.   Loura Pardon, MD

## 2018-12-02 NOTE — Patient Instructions (Addendum)
I will send you a lab order for [facility]  Don't forget to follow up with your gyn doctor when able for annual visit and thyroid check  Also continue to work on getting a new primary care doctor where you live Try to eat a healthy diet low in fat and processed carbs  Exercise as tolerated Try to get 1200-1500 mg of calcium per day with at least 1000 iu of vitamin D - for bone health -this is important for bone health in light of your recent fracture

## 2018-12-04 NOTE — Addendum Note (Signed)
Addended by: Tammi Sou on: 12/04/2018 12:10 PM   Modules accepted: Orders

## 2018-12-08 ENCOUNTER — Telehealth: Payer: Self-pay | Admitting: *Deleted

## 2018-12-08 NOTE — Telephone Encounter (Signed)
What diagnosis?

## 2018-12-08 NOTE — Telephone Encounter (Signed)
Pt said she forgot to ask if Dr. Glori Bickers could fill out a handicap placard form for her, it's time to renew. Pt said we can mail it to her since she has moved out of town. Form in Dr. Marliss Coots inbox if she approves pt's request. Pt doesn't need a call back if Dr. Glori Bickers is okay filling it out she said we can just mail it

## 2018-12-08 NOTE — Telephone Encounter (Signed)
Left VM requesting pt to call the office back 

## 2018-12-12 NOTE — Telephone Encounter (Signed)
Dr. Glori Bickers filled out form and copy mailed to pt

## 2018-12-13 LAB — CBC WITH DIFFERENTIAL/PLATELET
Basophils Absolute: 0.1 10*3/uL (ref 0.0–0.2)
Basos: 1 %
EOS (ABSOLUTE): 0.3 10*3/uL (ref 0.0–0.4)
Eos: 5 %
Hematocrit: 45 % (ref 34.0–46.6)
Hemoglobin: 15.3 g/dL (ref 11.1–15.9)
Immature Grans (Abs): 0 10*3/uL (ref 0.0–0.1)
Immature Granulocytes: 0 %
Lymphocytes Absolute: 2 10*3/uL (ref 0.7–3.1)
Lymphs: 31 %
MCH: 30.4 pg (ref 26.6–33.0)
MCHC: 34 g/dL (ref 31.5–35.7)
MCV: 90 fL (ref 79–97)
Monocytes Absolute: 0.6 10*3/uL (ref 0.1–0.9)
Monocytes: 10 %
Neutrophils Absolute: 3.4 10*3/uL (ref 1.4–7.0)
Neutrophils: 53 %
Platelets: 229 10*3/uL (ref 150–450)
RBC: 5.03 x10E6/uL (ref 3.77–5.28)
RDW: 12.8 % (ref 11.7–15.4)
WBC: 6.3 10*3/uL (ref 3.4–10.8)

## 2018-12-13 LAB — LIPID PANEL
Chol/HDL Ratio: 3.8 ratio (ref 0.0–4.4)
Cholesterol, Total: 180 mg/dL (ref 100–199)
HDL: 47 mg/dL (ref >39)
Triglycerides: 410 mg/dL — ABNORMAL HIGH (ref 0–149)

## 2018-12-13 LAB — COMPREHENSIVE METABOLIC PANEL
ALT: 54 IU/L — ABNORMAL HIGH (ref 0–32)
AST: 59 IU/L — ABNORMAL HIGH (ref 0–40)
Albumin/Globulin Ratio: 1.8 (ref 1.2–2.2)
Albumin: 4.5 g/dL (ref 3.7–4.7)
Alkaline Phosphatase: 99 IU/L (ref 39–117)
BUN/Creatinine Ratio: 19 (ref 12–28)
BUN: 13 mg/dL (ref 8–27)
Bilirubin Total: 0.4 mg/dL (ref 0.0–1.2)
CO2: 27 mmol/L (ref 20–29)
Calcium: 9.9 mg/dL (ref 8.7–10.3)
Chloride: 104 mmol/L (ref 96–106)
Creatinine, Ser: 0.7 mg/dL (ref 0.57–1.00)
GFR calc Af Amer: 99 mL/min/{1.73_m2} (ref >59)
GFR calc non Af Amer: 86 mL/min/{1.73_m2} (ref >59)
Globulin, Total: 2.5 g/dL (ref 1.5–4.5)
Glucose: 92 mg/dL (ref 65–99)
Potassium: 4.7 mmol/L (ref 3.5–5.2)
Sodium: 146 mmol/L — ABNORMAL HIGH (ref 134–144)
Total Protein: 7 g/dL (ref 6.0–8.5)

## 2018-12-13 LAB — HEMOGLOBIN A1C
Est. average glucose Bld gHb Est-mCnc: 120 mg/dL
Hgb A1c MFr Bld: 5.8 % — ABNORMAL HIGH (ref 4.8–5.6)

## 2018-12-13 LAB — TSH: TSH: 2.61 u[IU]/mL (ref 0.450–4.500)

## 2018-12-14 ENCOUNTER — Encounter: Payer: Self-pay | Admitting: *Deleted

## 2019-02-25 ENCOUNTER — Other Ambulatory Visit: Payer: Self-pay | Admitting: Family Medicine

## 2019-02-26 NOTE — Telephone Encounter (Signed)
Last appt was 12/01/18, med was refilled on 12/01/18 #30 with 2 refills please advise

## 2019-11-26 ENCOUNTER — Other Ambulatory Visit: Payer: Self-pay | Admitting: Family Medicine

## 2019-11-26 NOTE — Telephone Encounter (Signed)
Last I checked-pt moved to [place].  She needs in person follow up when able- I suspect she will need to find someone there  Please refill once to get by until she can see a person there or f/u here Thanks

## 2019-11-26 NOTE — Telephone Encounter (Signed)
Pt hasn't been seen in almost a year and last OV was a phone call on 12/01/18, no future appts, please advise

## 2019-11-27 NOTE — Telephone Encounter (Signed)
Left VM requesting pt to call the office back 

## 2019-11-29 MED ORDER — METOPROLOL TARTRATE 100 MG PO TABS: 100.0000 mg | ORAL_TABLET | Freq: Two times a day (BID) | ORAL | 0 refills | Status: AC

## 2019-11-29 NOTE — Telephone Encounter (Signed)
Med refilled once per Dr. Glori Bickers until she gets new PCP

## 2019-11-29 NOTE — Telephone Encounter (Signed)
Patient returned call Advised of Dr Marliss Coots message, voiced understanding.  Patient will call us back to update on locating new pcp
